# Patient Record
Sex: Male | Born: 1935 | Race: White | Hispanic: No | Marital: Married | Smoking: Never smoker
Health system: Southern US, Community
[De-identification: ages and names within clinical notes are randomized; demographics above are authoritative.]

## PROBLEM LIST (undated history)

## (undated) DIAGNOSIS — N189 Chronic kidney disease, unspecified: Secondary | ICD-10-CM

## (undated) DIAGNOSIS — K219 Gastro-esophageal reflux disease without esophagitis: Secondary | ICD-10-CM

## (undated) DIAGNOSIS — I442 Atrioventricular block, complete: Secondary | ICD-10-CM

## (undated) DIAGNOSIS — I428 Other cardiomyopathies: Secondary | ICD-10-CM

## (undated) DIAGNOSIS — I351 Nonrheumatic aortic (valve) insufficiency: Secondary | ICD-10-CM

## (undated) DIAGNOSIS — I10 Essential (primary) hypertension: Secondary | ICD-10-CM

## (undated) DIAGNOSIS — I4891 Unspecified atrial fibrillation: Secondary | ICD-10-CM

## (undated) HISTORY — PX: ABLATION: SHX5711

## (undated) HISTORY — PX: REPAIR OF ACUTE ASCENDING THORACIC AORTIC DISSECTION: SHX6323

## (undated) HISTORY — PX: PACEMAKER PLACEMENT: SHX43

---

## 2004-04-25 ENCOUNTER — Other Ambulatory Visit: Payer: Self-pay

## 2004-10-05 ENCOUNTER — Ambulatory Visit: Payer: Self-pay | Admitting: Urology

## 2006-01-11 ENCOUNTER — Ambulatory Visit: Payer: Self-pay | Admitting: Urology

## 2009-06-27 ENCOUNTER — Ambulatory Visit: Payer: Self-pay | Admitting: Internal Medicine

## 2009-07-02 ENCOUNTER — Ambulatory Visit: Payer: Self-pay | Admitting: Unknown Physician Specialty

## 2009-07-04 ENCOUNTER — Ambulatory Visit: Payer: Self-pay | Admitting: Unknown Physician Specialty

## 2011-04-28 ENCOUNTER — Ambulatory Visit: Payer: Self-pay | Admitting: Internal Medicine

## 2011-11-03 ENCOUNTER — Ambulatory Visit: Payer: Self-pay | Admitting: Vascular Surgery

## 2013-01-10 ENCOUNTER — Ambulatory Visit: Payer: Self-pay | Admitting: Internal Medicine

## 2013-03-19 ENCOUNTER — Ambulatory Visit: Payer: Self-pay | Admitting: Internal Medicine

## 2013-10-03 ENCOUNTER — Ambulatory Visit: Payer: Self-pay | Admitting: Internal Medicine

## 2014-04-26 ENCOUNTER — Inpatient Hospital Stay: Payer: Self-pay | Admitting: Internal Medicine

## 2014-04-26 LAB — CBC
HCT: 29.9 % — ABNORMAL LOW (ref 40.0–52.0)
HGB: 9.3 g/dL — ABNORMAL LOW (ref 13.0–18.0)
MCH: 30.8 pg (ref 26.0–34.0)
MCHC: 31 g/dL — ABNORMAL LOW (ref 32.0–36.0)
MCV: 99 fL (ref 80–100)
Platelet: 208 10*3/uL (ref 150–440)
RBC: 3.01 10*6/uL — ABNORMAL LOW (ref 4.40–5.90)
RDW: 17.3 % — AB (ref 11.5–14.5)
WBC: 7.8 10*3/uL (ref 3.8–10.6)

## 2014-04-26 LAB — COMPREHENSIVE METABOLIC PANEL
ALBUMIN: 3 g/dL — AB (ref 3.4–5.0)
ALT: 25 U/L (ref 12–78)
Alkaline Phosphatase: 145 U/L — ABNORMAL HIGH
Anion Gap: 5 — ABNORMAL LOW (ref 7–16)
BUN: 23 mg/dL — ABNORMAL HIGH (ref 7–18)
Bilirubin,Total: 0.9 mg/dL (ref 0.2–1.0)
CREATININE: 1.25 mg/dL (ref 0.60–1.30)
Calcium, Total: 8.1 mg/dL — ABNORMAL LOW (ref 8.5–10.1)
Chloride: 107 mmol/L (ref 98–107)
Co2: 28 mmol/L (ref 21–32)
EGFR (African American): >60
EGFR (Non-African Amer.): 53 — ABNORMAL LOW
Glucose: 91 mg/dL (ref 65–99)
OSMOLALITY: 283 (ref 275–301)
Potassium: 4.1 mmol/L (ref 3.5–5.1)
SGOT(AST): 29 U/L (ref 15–37)
Sodium: 140 mmol/L (ref 136–145)
Total Protein: 6.8 g/dL (ref 6.4–8.2)

## 2014-04-26 LAB — PROTIME-INR
INR: 1.9
Prothrombin Time: 21.2 secs — ABNORMAL HIGH (ref 11.5–14.7)

## 2014-04-27 LAB — CBC WITH DIFFERENTIAL/PLATELET
BASOS ABS: 0.1 10*3/uL (ref 0.0–0.1)
Basophil %: 1.2 %
EOS PCT: 8.3 %
Eosinophil #: 0.6 10*3/uL (ref 0.0–0.7)
HCT: 28.6 % — ABNORMAL LOW (ref 40.0–52.0)
HGB: 8.9 g/dL — AB (ref 13.0–18.0)
LYMPHS ABS: 1.1 10*3/uL (ref 1.0–3.6)
Lymphocyte %: 15.5 %
MCH: 30.8 pg (ref 26.0–34.0)
MCHC: 31.2 g/dL — ABNORMAL LOW (ref 32.0–36.0)
MCV: 99 fL (ref 80–100)
MONO ABS: 1 x10 3/mm (ref 0.2–1.0)
MONOS PCT: 13.8 %
NEUTROS PCT: 61.2 %
Neutrophil #: 4.4 10*3/uL (ref 1.4–6.5)
PLATELETS: 200 10*3/uL (ref 150–440)
RBC: 2.9 10*6/uL — ABNORMAL LOW (ref 4.40–5.90)
RDW: 17.4 % — AB (ref 11.5–14.5)
WBC: 7.1 10*3/uL (ref 3.8–10.6)

## 2014-04-27 LAB — PROTIME-INR
INR: 2.1
PROTHROMBIN TIME: 23.3 s — AB (ref 11.5–14.7)

## 2014-04-28 ENCOUNTER — Ambulatory Visit: Payer: Self-pay | Admitting: Orthopedic Surgery

## 2014-04-28 LAB — CBC WITH DIFFERENTIAL/PLATELET
BASOS ABS: 0.1 10*3/uL (ref 0.0–0.1)
BASOS PCT: 0.9 %
EOS ABS: 0.7 10*3/uL (ref 0.0–0.7)
Eosinophil %: 8.6 %
HCT: 28.5 % — AB (ref 40.0–52.0)
HGB: 9 g/dL — ABNORMAL LOW (ref 13.0–18.0)
Lymphocyte #: 1.3 10*3/uL (ref 1.0–3.6)
Lymphocyte %: 17 %
MCH: 31.4 pg (ref 26.0–34.0)
MCHC: 31.6 g/dL — AB (ref 32.0–36.0)
MCV: 99 fL (ref 80–100)
Monocyte #: 0.9 x10 3/mm (ref 0.2–1.0)
Monocyte %: 12.1 %
Neutrophil #: 4.7 10*3/uL (ref 1.4–6.5)
Neutrophil %: 61.4 %
PLATELETS: 188 10*3/uL (ref 150–440)
RBC: 2.87 10*6/uL — ABNORMAL LOW (ref 4.40–5.90)
RDW: 17.6 % — ABNORMAL HIGH (ref 11.5–14.5)
WBC: 7.6 10*3/uL (ref 3.8–10.6)

## 2014-04-28 LAB — BASIC METABOLIC PANEL
Anion Gap: 7 (ref 7–16)
BUN: 23 mg/dL — ABNORMAL HIGH (ref 7–18)
CALCIUM: 8.6 mg/dL (ref 8.5–10.1)
CREATININE: 1.35 mg/dL — AB (ref 0.60–1.30)
Chloride: 104 mmol/L (ref 98–107)
Co2: 27 mmol/L (ref 21–32)
GFR CALC AF AMER: 56 — AB
GFR CALC NON AF AMER: 48 — AB
GLUCOSE: 97 mg/dL (ref 65–99)
Osmolality: 279 (ref 275–301)
Potassium: 3.9 mmol/L (ref 3.5–5.1)
SODIUM: 138 mmol/L (ref 136–145)

## 2014-04-28 LAB — PROTIME-INR
INR: 2.1
Prothrombin Time: 23.4 secs — ABNORMAL HIGH (ref 11.5–14.7)

## 2014-04-29 LAB — CBC WITH DIFFERENTIAL/PLATELET
BASOS ABS: 0.1 10*3/uL (ref 0.0–0.1)
Basophil %: 1.4 %
EOS ABS: 0.7 10*3/uL (ref 0.0–0.7)
EOS PCT: 8.6 %
HCT: 28 % — ABNORMAL LOW (ref 40.0–52.0)
HGB: 8.9 g/dL — AB (ref 13.0–18.0)
Lymphocyte #: 1.3 10*3/uL (ref 1.0–3.6)
Lymphocyte %: 16.6 %
MCH: 31.4 pg (ref 26.0–34.0)
MCHC: 31.9 g/dL — AB (ref 32.0–36.0)
MCV: 99 fL (ref 80–100)
MONO ABS: 1 x10 3/mm (ref 0.2–1.0)
Monocyte %: 12.6 %
NEUTROS ABS: 4.9 10*3/uL (ref 1.4–6.5)
NEUTROS PCT: 60.8 %
Platelet: 199 10*3/uL (ref 150–440)
RBC: 2.84 10*6/uL — ABNORMAL LOW (ref 4.40–5.90)
RDW: 17.4 % — ABNORMAL HIGH (ref 11.5–14.5)
WBC: 8.1 10*3/uL (ref 3.8–10.6)

## 2014-04-29 LAB — PROTIME-INR
INR: 2.2
PROTHROMBIN TIME: 24.2 s — AB (ref 11.5–14.7)

## 2014-04-30 LAB — CBC WITH DIFFERENTIAL/PLATELET
Basophil #: 0.1 10*3/uL (ref 0.0–0.1)
Basophil %: 1.4 %
EOS ABS: 0.6 10*3/uL (ref 0.0–0.7)
Eosinophil %: 9.1 %
HCT: 27.8 % — ABNORMAL LOW (ref 40.0–52.0)
HGB: 8.6 g/dL — AB (ref 13.0–18.0)
LYMPHS ABS: 1.2 10*3/uL (ref 1.0–3.6)
LYMPHS PCT: 17.7 %
MCH: 30.6 pg (ref 26.0–34.0)
MCHC: 31 g/dL — AB (ref 32.0–36.0)
MCV: 99 fL (ref 80–100)
MONO ABS: 0.8 x10 3/mm (ref 0.2–1.0)
Monocyte %: 11.8 %
NEUTROS PCT: 60 %
Neutrophil #: 4.1 10*3/uL (ref 1.4–6.5)
Platelet: 200 10*3/uL (ref 150–440)
RBC: 2.82 10*6/uL — AB (ref 4.40–5.90)
RDW: 17.5 % — ABNORMAL HIGH (ref 11.5–14.5)
WBC: 6.8 10*3/uL (ref 3.8–10.6)

## 2014-04-30 LAB — BASIC METABOLIC PANEL
ANION GAP: 7 (ref 7–16)
BUN: 29 mg/dL — AB (ref 7–18)
CALCIUM: 8.1 mg/dL — AB (ref 8.5–10.1)
Chloride: 105 mmol/L (ref 98–107)
Co2: 27 mmol/L (ref 21–32)
Creatinine: 1.54 mg/dL — ABNORMAL HIGH (ref 0.60–1.30)
EGFR (Non-African Amer.): 41 — ABNORMAL LOW
GFR CALC AF AMER: 48 — AB
Glucose: 124 mg/dL — ABNORMAL HIGH (ref 65–99)
OSMOLALITY: 285 (ref 275–301)
POTASSIUM: 3.7 mmol/L (ref 3.5–5.1)
Sodium: 139 mmol/L (ref 136–145)

## 2014-04-30 LAB — PROTIME-INR
INR: 2.5
Prothrombin Time: 26.7 secs — ABNORMAL HIGH (ref 11.5–14.7)

## 2014-04-30 LAB — VANCOMYCIN, TROUGH: Vancomycin, Trough: 15 ug/mL (ref 10–20)

## 2014-05-01 LAB — CBC WITH DIFFERENTIAL/PLATELET
BASOS PCT: 0.8 %
Basophil #: 0.1 10*3/uL (ref 0.0–0.1)
EOS ABS: 0.8 10*3/uL — AB (ref 0.0–0.7)
Eosinophil %: 10.3 %
HCT: 28 % — ABNORMAL LOW (ref 40.0–52.0)
HGB: 8.8 g/dL — ABNORMAL LOW (ref 13.0–18.0)
LYMPHS ABS: 1.1 10*3/uL (ref 1.0–3.6)
Lymphocyte %: 14.4 %
MCH: 30.9 pg (ref 26.0–34.0)
MCHC: 31.5 g/dL — ABNORMAL LOW (ref 32.0–36.0)
MCV: 98 fL (ref 80–100)
MONOS PCT: 13.1 %
Monocyte #: 1 x10 3/mm (ref 0.2–1.0)
NEUTROS PCT: 61.4 %
Neutrophil #: 4.7 10*3/uL (ref 1.4–6.5)
Platelet: 202 10*3/uL (ref 150–440)
RBC: 2.85 10*6/uL — AB (ref 4.40–5.90)
RDW: 17.4 % — AB (ref 11.5–14.5)
WBC: 7.6 10*3/uL (ref 3.8–10.6)

## 2014-05-01 LAB — BASIC METABOLIC PANEL
Anion Gap: 9 (ref 7–16)
BUN: 24 mg/dL — ABNORMAL HIGH (ref 7–18)
CREATININE: 1.43 mg/dL — AB (ref 0.60–1.30)
Calcium, Total: 8.3 mg/dL — ABNORMAL LOW (ref 8.5–10.1)
Chloride: 103 mmol/L (ref 98–107)
Co2: 26 mmol/L (ref 21–32)
EGFR (African American): 52 — ABNORMAL LOW
EGFR (Non-African Amer.): 45 — ABNORMAL LOW
Glucose: 126 mg/dL — ABNORMAL HIGH (ref 65–99)
OSMOLALITY: 281 (ref 275–301)
Potassium: 3.6 mmol/L (ref 3.5–5.1)
SODIUM: 138 mmol/L (ref 136–145)

## 2014-05-01 LAB — PROTIME-INR
INR: 2.6
Prothrombin Time: 27 secs — ABNORMAL HIGH (ref 11.5–14.7)

## 2014-05-01 LAB — CULTURE, BLOOD (SINGLE)

## 2014-05-02 LAB — CBC WITH DIFFERENTIAL/PLATELET
Basophil #: 0.1 10*3/uL (ref 0.0–0.1)
Basophil %: 0.8 %
Eosinophil #: 0.8 10*3/uL — ABNORMAL HIGH (ref 0.0–0.7)
Eosinophil %: 9.7 %
HCT: 29.1 % — ABNORMAL LOW (ref 40.0–52.0)
HGB: 9.1 g/dL — ABNORMAL LOW (ref 13.0–18.0)
Lymphocyte #: 1.2 10*3/uL (ref 1.0–3.6)
Lymphocyte %: 14.4 %
MCH: 30.8 pg (ref 26.0–34.0)
MCHC: 31.2 g/dL — ABNORMAL LOW (ref 32.0–36.0)
MCV: 99 fL (ref 80–100)
MONOS PCT: 13.9 %
Monocyte #: 1.1 x10 3/mm — ABNORMAL HIGH (ref 0.2–1.0)
NEUTROS PCT: 61.2 %
Neutrophil #: 4.9 10*3/uL (ref 1.4–6.5)
PLATELETS: 199 10*3/uL (ref 150–440)
RBC: 2.95 10*6/uL — AB (ref 4.40–5.90)
RDW: 17.8 % — ABNORMAL HIGH (ref 11.5–14.5)
WBC: 8 10*3/uL (ref 3.8–10.6)

## 2014-05-02 LAB — COMPREHENSIVE METABOLIC PANEL
ALK PHOS: 130 U/L — AB
ALT: 17 U/L (ref 12–78)
AST: 23 U/L (ref 15–37)
Albumin: 2.8 g/dL — ABNORMAL LOW (ref 3.4–5.0)
Anion Gap: 6 — ABNORMAL LOW (ref 7–16)
BILIRUBIN TOTAL: 0.8 mg/dL (ref 0.2–1.0)
BUN: 24 mg/dL — AB (ref 7–18)
CO2: 28 mmol/L (ref 21–32)
CREATININE: 1.43 mg/dL — AB (ref 0.60–1.30)
Calcium, Total: 8.9 mg/dL (ref 8.5–10.1)
Chloride: 105 mmol/L (ref 98–107)
EGFR (African American): 52 — ABNORMAL LOW
EGFR (Non-African Amer.): 45 — ABNORMAL LOW
Glucose: 96 mg/dL (ref 65–99)
Osmolality: 281 (ref 275–301)
POTASSIUM: 3.4 mmol/L — AB (ref 3.5–5.1)
Sodium: 139 mmol/L (ref 136–145)
Total Protein: 6.4 g/dL (ref 6.4–8.2)

## 2014-05-02 LAB — IRON AND TIBC
IRON BIND. CAP.(TOTAL): 399 ug/dL (ref 250–450)
IRON: 33 ug/dL — AB (ref 65–175)
Iron Saturation: 8 %
Unbound Iron-Bind.Cap.: 366 ug/dL

## 2014-05-02 LAB — PROTIME-INR
INR: 2.6
PROTHROMBIN TIME: 27.4 s — AB (ref 11.5–14.7)

## 2014-05-02 LAB — TSH: Thyroid Stimulating Horm: 19.2 u[IU]/mL — ABNORMAL HIGH

## 2014-05-02 LAB — SEDIMENTATION RATE: ERYTHROCYTE SED RATE: 36 mm/h — AB (ref 0–20)

## 2014-05-02 LAB — RETICULOCYTES
ABSOLUTE RETIC COUNT: 0.073 10*6/uL (ref 0.019–0.186)
Reticulocyte: 2.5 % (ref 0.4–3.1)

## 2014-05-03 LAB — BASIC METABOLIC PANEL
ANION GAP: 7 (ref 7–16)
BUN: 29 mg/dL — AB (ref 7–18)
CALCIUM: 8.5 mg/dL (ref 8.5–10.1)
CO2: 27 mmol/L (ref 21–32)
Chloride: 104 mmol/L (ref 98–107)
Creatinine: 1.53 mg/dL — ABNORMAL HIGH (ref 0.60–1.30)
EGFR (African American): 48 — ABNORMAL LOW
GFR CALC NON AF AMER: 41 — AB
Glucose: 107 mg/dL — ABNORMAL HIGH (ref 65–99)
Osmolality: 282 (ref 275–301)
Potassium: 3.5 mmol/L (ref 3.5–5.1)
Sodium: 138 mmol/L (ref 136–145)

## 2014-05-03 LAB — PROTEIN ELECTROPHORESIS(ARMC)

## 2014-05-03 LAB — PROTIME-INR
INR: 2.7
PROTHROMBIN TIME: 28.1 s — AB (ref 11.5–14.7)

## 2014-05-03 LAB — HEMATOCRIT: HCT: 29.6 % — ABNORMAL LOW (ref 40.0–52.0)

## 2014-05-03 LAB — VANCOMYCIN, TROUGH: Vancomycin, Trough: 17 ug/mL (ref 10–20)

## 2014-05-07 LAB — UR PROT ELECTROPHORESIS, URINE RANDOM

## 2014-10-30 ENCOUNTER — Ambulatory Visit: Payer: Self-pay | Admitting: Internal Medicine

## 2014-12-03 ENCOUNTER — Ambulatory Visit: Payer: Self-pay | Admitting: Gastroenterology

## 2014-12-03 LAB — HEMOGLOBIN: HGB: 9.7 g/dL — AB (ref 13.0–18.0)

## 2015-03-01 NOTE — Consult Note (Signed)
CHIEF COMPLAINT and HISTORY:  Subjective/Chief Complaint Left leg cellulitis, failed outpatient tx   History of Present Illness 81 year old white male who fell few weeks ago and injured his left great toe. He then had worsening erythema and edema of the left leg which failed outpatient treatment with keflex. He is now currently on vanc and zosyn. He reports having congestive heart failure and having good control of his lower extremity edema with compression stockings and diuretics. He reports no significant pain to the left leg. No fevers.   PAST MEDICAL/SURGICAL HISTORY:  Past Medical History:   Gout:    Hypertension:    CHF:    Atrial fibrillation:    torn aorta:    Aortic Repair:    cardiac ablation:    Pacemaker:   ALLERGIES:  Allergies:  Oxycontin: Anaphylaxis, GI Distress  HOME MEDICATIONS:  Home Medications: Medication Instructions Status  cefuroxime 500 mg oral tablet 1 tab(s) orally 2 times a day x 10 days. Active  warfarin 5 mg oral tablet 1 tab(s) orally once a day (in the evening) Active  Aspirin Enteric Coated 81 mg oral delayed release tablet 1 tab(s) orally once a day Active  allopurinol 100 mg oral tablet 1 tab(s) orally 2 times a day Active  amitriptyline 25 mg oral tablet 1 or 2 tab(s) orally once a day (at bedtime) as needed for insomnia. Active  torsemide 20 mg oral tablet 1 tab(s) orally once a day (in the morning) and 2 tabs (6m) orally once a day (at bedtime) Active  levothyroxine 100 mcg (0.1 mg) oral tablet 1 tab(s) orally once a day Active  folic acid 1 mg oral tablet 1 tab(s) orally once a day Active   Family and Social History:  Family History Stroke   Social History negative tobacco, negative ETOH, negative Illicit drugs   Review of Systems:  Fever/Chills No   Cough No   Sputum No   Abdominal Pain No   Diarrhea No   Constipation No   Nausea/Vomiting No   SOB/DOE Yes  with laying flat   Chest Pain No   Physical Exam:   GEN no acute distress   HEENT PERRL, hearing intact to voice   NECK supple  No masses   RESP normal resp effort   CARD irregular rate  LE edema present   VASCULAR ACCESS 2+left DP, difficult to palpate right DP   ABD denies tenderness  soft  normal BS   LYMPH negative neck   EXTR negative cyanosis/clubbing, positive edema   SKIN Bilateral LE edema, erythema minimally on the right LE and extensive on the left leg- mostly the lower leg and foot but light erythema extends to the mid thigh, left great toe ulcer dressed, nontender to palpation   NEURO cranial nerves intact   PSYCH alert, A+O to time, place, person   LABS:  Laboratory Results: LabObservation:    21-Jun-15 12:23, [place] Low Extrem Left (Leg)  OBSERVATION   Reason for Test Edema  Hepatic:    19-Jun-15 12:40, Comprehensive Metabolic Panel  Bilirubin, Total 0.9  Alkaline Phosphatase 145  45-117  NOTE: New Reference Range  09/28/13  SGPT (ALT) 25  SGOT (AST) 29  Total Protein, Serum 6.8  Albumin, Serum 3.0  Routine Micro:    19-Jun-15 15:01, Blood Culture  Micro Text Report   BLOOD CULTURE    COMMENT  NO GROWTH IN 48 HOURS     ANTIBIOTIC  Culture Comment   NO GROWTH IN 48 HOURS   Result(s) reported on 28 Apr 2014 at 03:00PM.  Micro Text Report   BLOOD CULTURE    COMMENT                   NO GROWTH IN 48 HOURS     ANTIBIOTIC  Culture Comment   NO GROWTH IN 32 HOURS   Result(s) reported on 28 Apr 2014 at 03:00PM.  Routine Chem:    19-Jun-15 12:40, Comprehensive Metabolic Panel  Glucose, Serum 91  BUN 23  Creatinine (comp) 1.25  Sodium, Serum 140  Potassium, Serum 4.1  Chloride, Serum 107  CO2, Serum 28  Calcium (Total), Serum 8.1  Osmolality (calc) 283  eGFR (African American) >60  eGFR (Non-African American) 53  eGFR values <19m/min/1.73 m2 may be an indication of chronic  kidney disease (CKD).  Calculated eGFR is useful in patients with stable renal  function.  The eGFR calculation will not be reliable in acutely ill patients  when serum creatinine is changing rapidly. It is not useful in   patients on dialysis. The eGFR calculation may not be applicable  to patients at the low and high extremes of body sizes, pregnant  women, and vegetarians.  Anion Gap 5    21-Jun-15 080:99 Basic Metabolic Panel (w/Total Calcium)  Glucose, Serum 97  BUN 23  Creatinine (comp) 1.35  Sodium, Serum 138  Potassium, Serum 3.9  Chloride, Serum 104  CO2, Serum 27  Calcium (Total), Serum 8.6  Anion Gap 7  Osmolality (calc) 279  eGFR (African American) 56  eGFR (Non-African American) 48  eGFR values <643mmin/1.73 m2 may be an indication of chronic  kidney disease (CKD).  Calculated eGFR is useful in patients with stable renal function.  The eGFR calculation will not be reliable in acutely ill patients  when serum creatinine is changing rapidly. It is not useful in   patients on dialysis. The eGFR calculation may not be applicable  to patients at the low and high extremes of body sizes, pregnant  women, and vegetarians.  Routine Coag:    19-Jun-15 12:40, Prothrombin Time  Prothrombin 21.2  INR 1.9  INR reference interval applies to patients on anticoagulant therapy.  A single INR therapeutic range for coumarins is not optimal for all  indications; however, the suggested range for most indications is  2.0 - 3.0.  Exceptions to the INR Reference Range may include: Prosthetic heart  valves, acute myocardial infarction, prevention of myocardial  infarction, and combinations of aspirin and anticoagulant. The need  for a higher or lower target INR must be assessed individually.  Reference: The Pharmacology and Management of the Vitamin K   antagonists: the seventh ACCP Conference on Antithrombotic and  Thrombolytic Therapy. [id number]:126 (3suppl): [id number] A HCT value >55% may artifactually increase the PT.  In one study,   the increase was  an average of 25%.  Reference:  "Effect on Routine and Special Coagulation Testing Values  of Citrate Anticoagulant Adjustment in Patients with High HCT Values."  American Journal of Clinical Pathology 2006;126:400-405.    20-Jun-15 04:19, Prothrombin Time  Prothrombin 23.3  INR 2.1  INR reference interval applies to patients on anticoagulant therapy.  A single INR therapeutic range for coumarins is not optimal for all  indications; however, the suggested range for most indications is  2.0 - 3.0.  Exceptions to the INR Reference  Range may include: Prosthetic heart  valves, acute myocardial infarction, prevention of myocardial  infarction, and combinations of aspirin and anticoagulant. The need  for a higher or lower target INR must be assessed individually.  Reference: The Pharmacology and Management of the Vitamin K   antagonists: the seventh ACCP Conference on Antithrombotic and  Thrombolytic Therapy. [id number] Sept:126 (3suppl): [id number].  A HCT value >55% may artifactually increase the PT.  In one study,   the increase was an average of 25%.  Reference:  "Effect on Routine and Special Coagulation Testing Values  of Citrate Anticoagulant Adjustment in Patients with High HCT Values."  American Journal of Clinical Pathology 2006;126:400-405.    21-Jun-15 04:04, Prothrombin Time  Prothrombin 23.4  INR 2.1  INR reference interval applies to patients on anticoagulant therapy.  A single INR therapeutic range for coumarins is not optimal for all  indications; however, the suggested range for most indications is  2.0 - 3.0.  Exceptions to the INR Reference Range may include: Prosthetic heart  valves, acute myocardial infarction, prevention of myocardial  infarction, and combinations of aspirin and anticoagulant. The need  for a higher or lower target INR must be assessed individually.  Reference: The Pharmacology and Management of the Vitamin K   antagonists: the seventh ACCP  Conference on Antithrombotic and  Thrombolytic Therapy. [id number] Sept:126 (3suppl): [id number].  A HCT value >55% may artifactually increase the PT.  In one study,   the increase was an average of 25%.  Reference:  "Effect on Routine and Special Coagulation Testing Values  of Citrate Anticoagulant Adjustment in Patients with High HCT Values."  American Journal of Clinical Pathology 2006;126:400-405.    22-Jun-15 05:44, Prothrombin Time  Prothrombin 24.2  INR 2.2  INR reference interval applies to patients on anticoagulant therapy.  A single INR therapeutic range for coumarins is not optimal for all  indications; however, the suggested range for most indications is  2.0 - 3.0.  Exceptions to the INR Reference Range may include: Prosthetic heart  valves, acute myocardial infarction, prevention of myocardial  infarction, and combinations of aspirin and anticoagulant. The need  for a higher or lower target INR must be assessed individually.  Reference: The Pharmacology and Management of the Vitamin K   antagonists: the seventh ACCP Conference on Antithrombotic and  Thrombolytic Therapy. [id number] Sept:126 (3suppl): [id number].  A HCT value >55% may artifactually increase the PT.  In one study,   the increase was an average of 25%.  Reference:  "Effect on Routine and Special Coagulation Testing Values  of Citrate Anticoagulant Adjustment in Patients with High HCT Values."  American Journal of Clinical Pathology 2006;126:400-405.  Routine Hem:    19-Jun-15 12:40, Hemogram, Platelet Count  WBC (CBC) 7.8  RBC (CBC) 3.01  Hemoglobin (CBC) 9.3  Hematocrit (CBC) 29.9  Platelet Count (CBC) 208  Result(s) reported on 26 Apr 2014 at 12:57PM.  MCV 99  MCH 30.8  MCHC 31.0  RDW 17.3    20-Jun-15 04:19, CBC Profile  WBC (CBC) 7.1  RBC (CBC) 2.90  Hemoglobin (CBC) 8.9  Hematocrit (CBC) 28.6  Platelet Count (CBC) 200  MCV 99  MCH 30.8  MCHC 31.2  RDW 17.4  Neutrophil % 61.2  Lymphocyte  % 15.5  Monocyte % 13.8  Eosinophil % 8.3  Basophil % 1.2  Neutrophil # 4.4  Lymphocyte # 1.1  Monocyte # 1.0  Eosinophil # 0.6  Basophil # 0.1  Result(s) reported on 27 Apr 2014 at 05:31AM.  21-Jun-15 04:04, CBC Profile  WBC (CBC) 7.6  RBC (CBC) 2.87  Hemoglobin (CBC) 9.0  Hematocrit (CBC) 28.5  Platelet Count (CBC) 188  MCV 99  MCH 31.4  MCHC 31.6  RDW 17.6  Neutrophil % 61.4  Lymphocyte % 17.0  Monocyte % 12.1  Eosinophil % 8.6  Basophil % 0.9  Neutrophil # 4.7  Lymphocyte # 1.3  Monocyte # 0.9  Eosinophil # 0.7  Basophil # 0.1  Result(s) reported on 28 Apr 2014 at 04:49AM.    22-Jun-15 05:44, CBC Profile  WBC (CBC) 8.1  RBC (CBC) 2.84  Hemoglobin (CBC) 8.9  Hematocrit (CBC) 28.0  Platelet Count (CBC) 199  MCV 99  MCH 31.4  MCHC 31.9  RDW 17.4  Neutrophil % 60.8  Lymphocyte % 16.6  Monocyte % 12.6  Eosinophil % 8.6  Basophil % 1.4  Neutrophil # 4.9  Lymphocyte # 1.3  Monocyte # 1.0  Eosinophil # 0.7  Basophil # 0.1  Result(s) reported on 29 Apr 2014 at 06:07AM.   RADIOLOGY:  Radiology Results: [place]:    21-Jun-15 12:23, [place] Color Flow Doppler Low Extrem Left (Leg)  [place] Color Flow Doppler Low Extrem Left (Leg)  REASON FOR EXAM:    Edema  COMMENTS:       PROCEDURE: [place]  - US DOPPLER LOW EXTR LEFT  - Apr 28 2014 12:23PM     CLINICAL DATA:  Edema    EXAM:  Left LOWER EXTREMITY VENOUS DOPPLER ULTRASOUND    TECHNIQUE:  Gray-scale sonography with graded compression, as well as color  Doppler and duplex ultrasound were performed to evaluate the lower  extremity deep venous systems from the level of the common femoral  vein and including the common femoral, femoral, profunda femoral,  popliteal and calf veins including the posterior tibial, peroneal  and gastrocnemius veins when visible. The superficial great  saphenous vein was also interrogated. Spectral Doppler was utilized  to evaluate flow at rest and with distal augmentation maneuvers in  the  common femoral, femoral and popliteal veins.    COMPARISON:  None.    FINDINGS:  Common Femoral Vein: No evidence of thrombus. Normal  compressibility, respiratory phasicity and response to augmentation.    Saphenofemoral Junction: No evidence of thrombus. Normal  compressibility and flow on color Doppler imaging.  Profunda Femoral Vein: No evidence of thrombus. Normal  compressibility and flow on color Doppler imaging.    Femoral Vein: No evidence of thrombus. Normal compressibility,  respiratory phasicity and response to augmentation.    Popliteal Vein: No evidence of thrombus. Normal compressibility,  respiratory phasicity and response to augmentation.    Calf Veins: No evidence of thrombus. Normal compressibility and flow  on color Doppler imaging.    Superficial Great Saphenous Vein: No evidence of thrombus. Normal  compressibility and flow on color Doppler imaging.  Venous Reflux:  None.    Other Findings:  None.     IMPRESSION:  No evidence of deep venous thrombosis.      Electronically Signed    By: Margaree Mackintosh M.D.    On: 04/28/2014 12:24         Verified By: Mikki Santee, M.D., MD  LabUnknown:    26-Nov-14 10:49, CT Chest With Contrast  PACS Image    21-Jun-15 12:23, [place] Color Flow Doppler Low Extrem Left (Leg)  PACS Image  CT:    26-Nov-14 10:49, CT Chest With Contrast  CT Chest With Contrast  REASON FOR EXAM:  hemoptysis  COMMENTS:       PROCEDURE: KCT - KCT CHEST WITH CONTRAST  - Oct 03 2013 10:49AM     CLINICAL DATA:  Hemoptysis for 1 month.    EXAM:  CT CHEST WITH CONTRAST    TECHNIQUE:  Multidetector CT imaging of the chest was performed during  intravenous contrast administration.    CONTRAST:  75 cc Isovue 370  COMPARISON:  Multiple priors, including 03/19/2013.    FINDINGS:  Lungs/Pleura:  Minimal motion degradation inferiorly.    Patchy bibasilar opacities which are greater on the right than left.  Favored to represent  atelectasis. Similar.    Small right-sided pleural effusion is not significantly changed. The  left-sided pleural effusion has resolved    Heart/Mediastinum: Pacer with leads at right ventricle. Similar  appearance of a focal outpouching of the transverse aorta. This  measures maximally 1.8 cm on image 19. No surrounding hemorrhage.  Cardiomegaly with coronary artery atherosclerosis. No pericardial  effusion. Pulmonary artery enlargement, with the outflow tract  measuring 3.9 cm. Upper normal 3.0 cm. No central pulmonary  embolism, on this non-dedicated study.    Extensive mediastinal adenopathy. Right paratracheal node measures  1.6 cm on image 23 and is unchanged. A subcarinal/azygos esophageal  recess node measures 2.2 cm on image 31 and is similar. No hilar  adenopathy.    Upper Abdomen: Old granulomatous disease in the liver. A tiny  lateral segment left liver lobe lesion which is likely a cyst.    Bones/Musculoskeletal:  Remote bilateral rib trauma.   IMPRESSION:  1. No acute process or explanation for hemoptysis. Similar bibasilar  opacities which are favored to represent atelectasis.  2. Similar focal outpouching in the transverse aorta. Given the rib  posttraumatic defects, suspicious for chronic pseudoaneurysm. No  acute complication identified.  3. Similar mediastinal adenopathy. Relative stability back to  11/03/2011 argues for a benign/reactive etiology. Although  nonspecific, this could be related to underlying congestive heart  failure.  4. Resolved small left pleural effusion which residual small right  pleural effusion.  5. Pulmonary artery enlargement suggests pulmonary arterial  hypertension.  Electronically Signed    By: Abigail Miyamoto M.D.    On: 10/03/2013 11:29         Verified By: Areta Haber, M.D.,   ASSESSMENT AND PLAN:  Plan Left lower extremity cellulitis and edema with great toe ulceration- currently on vancomycin and zosyn. Venous duplex  negative for DVT. He has strong left DP pulse. He is elevating his leg at about waist level. Would be benefitial to elevate at or above heart level as tolerated to help reduce the edema. Recommend compression when infection improves. Also would recommend outpatient venous workup. Discussed with Dr. Lucky Cowboy. CHF Afib on anticoagulation   Electronic Signatures: Su Grand (PA-C)  (Signed 22-Jun-15 17:47)  Authored: Chief Complaint and History, PAST MEDICAL/SURGICAL HISTORY, ALLERGIES, HOME MEDICATIONS, Family and Social History, Review of Systems, Physical Exam, LABS, RADIOLOGY, Assessment and Plan   Last Updated: 22-Jun-15 17:47 by Su Grand (PA-C)

## 2015-03-01 NOTE — H&P (Signed)
PATIENT NAME:  Andre [name], Andre [name] DATE OF BIRTH:  May 27, 1929  DATE OF ADMISSION:  04/26/2014  PRIMARY CARE PHYSICIAN: Dr. Aram BeechamJeffrey [name].   CHIEF COMPLAINT: Left lower extremity redness and swelling.   HISTORY OF PRESENT ILLNESS: This is an 81 year old male who presented to the hospital due to worsening left lower extremity redness and swelling. The patient apparently fell a couple of weeks back and had an open area to his great toe on his left leg. He then a week later noticed some redness to his foot. He went to see his primary care physician, and he was started on oral Keflex. The patient has been on oral Keflex for about a week, but the redness has started to streak upwards into his legs. He therefore went back to see his primary care physician, who then referred him to the ER for further evaluation and possible treatment with IV antibiotics for suspected cellulitis. The patient denies any documented fever. Does admit to some chills but no nausea, no vomiting, no abdominal pain, no chest pain, no shortness of breath, and no other associated symptoms presently.   REVIEW OF SYSTEMS:    CONSTITUTIONAL: No documented fever. No weight gain. No weight loss.  EYES: No blurred or double vision.  EARS, NOSE, THROAT: No tinnitus. No postnasal drip. No redness of the oropharynx.  RESPIRATORY: No cough, no wheeze, no hemoptysis, no dyspnea.  CARDIOVASCULAR: No chest pain. No orthopnea. No palpitations. No syncope.  GASTROINTESTINAL: No nausea, no vomiting. No diarrhea. No abdominal pain. No melena or hematochezia.  GENITOURINARY: No dysuria. No hematuria.  ENDOCRINE: No polyuria, nocturia, heat or cold intolerance.  HEMATOLOGIC: No anemia, no bruising, no bleeding.  INTEGUMENTARY: Positive cellulitic rash of the left lower extremity. No lesions.  MUSCULOSKELETAL: No arthritis. No swelling. Positive gout.  NEUROLOGIC: No numbness. No tingling. No ataxia. No seizure-type activity.   PSYCHIATRIC: No anxiety. No insomnia. No ADD.   PAST MEDICAL HISTORY: Consistent with history of chronic A. fib, status post pacemaker, hypothyroidism, gout, hypertension.   ALLERGIES: ALLERGIC TO OXYCONTIN.   SOCIAL HISTORY: No smoking. No alcohol abuse. No illicit drug abuse. Lives at home by himself.   FAMILY HISTORY: Mother and father are both deceased. Both died from complications of a stroke.   CURRENT MEDICATIONS: As follows: Allopurinol 100 mg b.i.d., amitriptyline 25 mg at bedtime as needed, cefuroxime 500 mg b.i.d. x 10 days, folic acid 1 mg 1 mg daily, Synthroid 100 mcg daily, torsemide 20 mg daily in the morning and 2 tabs at bedtime, warfarin 5 mg daily.   PHYSICAL EXAMINATION: Presently is as follows:  VITAL SIGNS: Temperature 97.8, pulse 71, respirations 18, blood pressure 141/80, sats 97% on room air.  GENERAL: He is a pleasant-appearing male in no apparent distress.  HEAD, EYES, EARS, NOSE, THROAT: Atraumatic, normocephalic. Extraocular muscles are intact. Pupils equal and reactive to light. Sclerae anicteric. No conjunctival injection. No oropharyngeal erythema.  NECK: Supple. There is no jugular venous distention. No bruits. No lymphadenopathy. No thyromegaly.  HEART: Regular rate and rhythm. He does have a II/VI systolic ejection murmur heard at the right sternal border. No rubs, no clicks.  LUNGS: Clear to auscultation bilaterally. No rales, no rhonchi, no wheezes.  ABDOMEN: Soft, flat, nontender, nondistended. Has good bowel sounds. No hepatosplenomegaly appreciated.  EXTREMITIES: No evidence of any cyanosis or clubbing, +2 pedal and radial pulses bilaterally. The patient does have left lower extremity swelling, which is about 1 or 2+ compared to the right.  SKIN: Moist, warm, with a cellulitic rash in the left lower extremity, which is warm to touch and slightly swollen.  NEUROLOGIC: He is alert, awake, and oriented x 3 with no focal motor or sensory deficits  appreciated bilaterally.  LYMPHATIC: There is no cervical or axillary lymphadenopathy.   LABORATORY DATA: Serum glucose of 91, BUN 23, creatinine 1.2, sodium 140, potassium 4.1, chloride 107, bicarbonate 28. LFTs are within normal limits. White cell count 7.8, hemoglobin 9.3, hematocrit 29.9, platelet count 208. INR is 1.9.   ASSESSMENT AND PLAN: This is an 81 year old male with a history of atrial fibrillation, status post pacemaker, hypothyroidism, gout, hypertension, presented to the hospital with left lower extremity swelling and redness progressively getting worse and having failed outpatient oral antibiotic therapy.  1.  Left lower extremity cellulitis: This is the likely clinical diagnosis given the patient's clinical symptoms. The patient has failed outpatient oral antibiotics, as he was started on Keflex about a week ago, so the patient is being admitted for IV antibiotics. I will start him on IV vancomycin, follow blood cultures and follow him clinically. The patient is currently afebrile with a normal white cell count.  2.  History of chronic atrial fibrillation, status post pacemaker: The patient is currently rate controlled. I will continue his Coumadin. INR is 1.9.  3.  Hypothyroidism: Continue Synthroid.  4.  History of gout: No acute attack. Continue allopurinol.  5.  Hypertension: Continue torsemide.   The patient is a full code.   He will be transferred to be transferred over to Dr. Judithann [name]' service.   TIME SPENT: 45 minutes.    ____________________________ Andre [name]. Andre Kaiser, MD vjs:jcm D: 04/26/2014 15:18:14 ET T: 04/26/2014 16:25:25 ET JOB#: [id number]  cc: Andre [name]. Andre Kaiser, MD, <Dictator> Andre Siren MD ELECTRONICALLY SIGNED 05/07/2014 20:43

## 2015-03-01 NOTE — Consult Note (Signed)
PATIENT NAME:  Andre [name], Andre [name] MR#:  [id number] DATE OF BIRTH:  01-Dec-1928  INFECTIOUS DISEASE CONSULTATION  DATE OF CONSULTATION:  05/01/2014  REQUESTING PHYSICIAN:  Osborne Oman, M.D.   CONSULTING PHYSICIAN:  Stann Mainland. Sampson Goon, MD  REASON FOR CONSULTATION:  Bilateral lower extremity cellulitis.   HISTORY OF PRESENT ILLNESS: This is a pleasant 81 year old gentleman who is relatively healthy and still works as a Technical sales engineer, who was admitted June 19, with increasing left lower extremity redness and swelling. He apparently had fallen and twisted his left ankle and an opening of his left great toe. He seemed to think this was healing up, but then that week later the foot became quite red. He was seen by Dr. Judithann Sheen and placed on Keflex; however, he developed worsening redness and streaking. He was sent to the Emergency Room where he was admitted for IV antibiotics.   Since that admission, the patient has been on broad-spectrum antibiotics, but the redness and swelling had increased. He is not elevating his feet as he has been instructed. When I see him today, he is sitting upright in a chair with his feet completely on the ground. He has not had any fevers or shaking chills. He reports not having much in the way of these symptoms in the past. He does take Lasix as an outpatient and says that has been helping to control some of his edema.   PAST MEDICAL HISTORY: 1.  History of A. fib.   2.  Hypothyroidism.  3.  Gout.  4.  Hypertension.   PAST SURGICAL HISTORY: Status post pacemaker placement.   SOCIAL HISTORY: No tobacco or alcohol use. He lives at home. 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   FAMILY HISTORY: Noncontributory.   ALLERGIES: OXYCONTIN.   REVIEW OF SYSTEMS: 11-systems reviewed and negative, except as per HPI.    ANTIBIOTICS SINCE ADMISSION: Include;  1.  Zosyn, begun June 18. 2.  Vancomycin, begun June 18.    PHYSICAL EXAMINATION: VITAL SIGNS:  Temperature 97.9, pulse 76, blood pressure 120/55, respirations 18, sat 96% on room air. He has been afebrile since admission.  GENERAL: He is pleasant. He is overweight, sitting in chair eating.  HEENT: Pupils equal, round and reactive to light and accommodation. Extraocular movements are intact. Sclerae anicteric.  OROPHARYNX: Clear.  NECK: Supple.  HEART: Distant heart sounds.  LUNGS: Clear to auscultation bilaterally.  ABDOMEN: Soft, obese.  EXTREMITIES:  He has 3+ edema bilateral to his upper shin. He has erythema and redness on the left extending upwards from the dorsum of the foot. On the right, he also has erythema midway up his calf. These are chronic changes appearing. He has a small ulceration on his left great toe medially. There is minimal purulence from this.   DATABASE: Doppler of lower extremity was negative for DVT. White blood count on admission was 7.8, hemoglobin 9.3, platelets 208. He has not had an elevated white count since admission. His INR is currently therapeutic, 2.6. Blood cultures x 2 are negative. Renal function shows a BUN of 23 and creatinine 1.25 on admission; currently, it is 24/1.53. Albumin is low at 3.0, vancomycin trough of 15.   IMPRESSION: An 81 year old gentleman admitted with increasing lower extremity edema and redness following a scrape of his left toe. He now has bilateral redness, as well as swelling. I suspect the main issue is more of the edema as opposed to the cellulitis. This is not a failure of antibiotic,  because he is already on vancomycin and Zosyn, but more of a failure of elevation of the leg and control of the edema.   RECOMMENDATIONS: 1.  Consider echocardiogram, if it has not been done recently.  2.  I would recommend more aggressive intravenous diuresis.  3.  I have asked for Unna boot placement on both legs. This will help with decompressing the edema.  4.   I have advised the patient when he is in bed, he should elevate the leg on 6  pillows.   5.  I would continue current antibiotics at this time, but if he gets any improvement with the removal of fluid, he could be transitioned to an oral antibiotic and discharged home on this while we continue to mobilize fluid and I can see him in outpatient setting.   Thank you for the consultation. I will be glad to follow with you.    ____________________________ Stann Mainlandavid P. Sampson GoonFitzgerald, MD dpf:aj D: 05/01/2014 23:53:52 ET T: 05/02/2014 05:27:19 ET JOB#: [id number]  cc: Stann Mainlandavid P. Sampson GoonFitzgerald, MD, <Dictator> DAVID Sampson GoonFITZGERALD MD ELECTRONICALLY SIGNED 05/02/2014 13:32

## 2015-03-01 NOTE — Consult Note (Signed)
Admit Diagnosis:   CELLULITIS: Onset Date: 26-Apr-2014, Status: Active, Description: CELLULITIS      Admit Reason:   Cellulitis, leg (682.6): Status: Active, Coding System: ICD9, Coded Name: Cellulitis and abscess of leg, except foot   Oxycontin: Anaphylaxis, GI Distress   General Aspect LLE cellulitis   Present Illness 81yo M with 2 wk history of cellulitis.  He stumbled and incurred and great toes injury about two weeks ago that subsequently got infected 1 week later.  He was seen and given oral antibiotics that failed to control the infection, with it progressing to cellulitis.  He is now about 3 weeks out from the initial injury.  He states the swelling has be getting progressively worse over the past 2 weeks.  He denies fever, chills, and night sweats.  He does not report extreme pain.   Case History and Physical Exam:  Chief Complaint LLE Cellulitis   Past Medical Health Afib s/p Pacer   HEENT PERLA   Neck/Nodes Supple  No Adenopathy   Abdomen Benign   Musculoskeletal Other  Focused LLE: Erythema from proximal tibia to distal toes.  3+ Pretibia Pitting edema.  No pain with passive or active ankle plantar/dorsalflexion or toe flexion/ext.  PT palpable with excellent cap refill distally.  Non tender to palpation of anterior/lateral/posterior compartments.  sensation to light touch is intact, he does have mild dysasthesias   Neurological Grossly WNL   Skin Other  blanching erythema    Impression LLE Cellulitis, no signs of compartment syndrome.   Plan Recommend:  1. Continue broad spectrum IV Abx and transition to oral when starts to show improvement 2. LLE US for DVT despite therapeutic INR. 3. MRI to rule out abscess formation, if an abscess is localized, a surgical decompression/debridement would be recommended. 4. Elevate LLE 5. Clean dressing changes to great toe BID.  4x4 and small amount of abx ointment.  Thank you for the opportunity to participate in this  patients care.   Electronic Signatures: Elaina Hoopsandell, Timmothy R (MD)  (Signed 21-Jun-15 11:20)  Authored: Health Issues, Home Medications, Allergies, General Aspect/Present Illness, History and Physical Exam, Impression/Plan   Last Updated: 21-Jun-15 11:20 by Elaina Hoopsandell, Timmothy R (MD)

## 2015-03-01 NOTE — Discharge Summary (Signed)
PATIENT NAME:  Andre EnglishMCVEY, Calan R MR#:  [id number] DATE OF BIRTH:  1929/07/14  DATE OF ADMISSION:  04/26/2014. DATE OF DISCHARGE:  05/03/2014.  FINAL DIAGNOSES:  1.  Bilateral lower extremity cellulitis.  2.  Venous stasis, acute on chronic.  3.  Atrial fibrillation.  4.  Chronic kidney disease, stage III. 5.  Anemia with iron deficiency.  6.  Hypothyroidism.  7.  Gout.  8.  Hypertension.  9.  Status post pacemaker placement.   HISTORY AND PHYSICAL: Please see dictated admission history and physical.   SUMMARY OF HOSPITAL COURSE: The patient was admitted with erythema in the lower extremities, worse in the left leg than the right, which appeared to progress through outpatient antibiotics. He was placed on vancomycin, Zosyn. Erythema continued to be an issue, although he did not have significant fevers over the course of his hospitalization. Infectious disease saw the patient, and they thought that a significant portion of his redness was also due to venous stasis, and so [employer] were applied. He was changed over to oral antibiotics and monitored with no spike in fever, no elevation of white count or evidence of fevers. Infectious disease felt that he was stable for discharge to home and so he was discharged in stable condition with his physical activity to be up as tolerated, with plan to follow up with infectious disease in 1 week. He should follow a low sodium diet. Unna Boots were applied to the legs, and he should elevate his legs when able.   DISCHARGE MEDICATIONS:  1.  Folate 1 mg p.o. daily.  2.  Levothyroxine 0.1 mg p.o. daily.  3.  Amitriptyline 25 mg 1 to 2 tabs p.o. at bedtime as needed for insomnia.  4.  Allopurinol 100 mg p.o. b.i.d.  5.  Aspirin 81 mg p.o. daily.  6.  Coumadin 5 mg p.o. in the evening.  7.  Torsemide 20 mg 2 tablets p.o. b.i.d.  8.  Potassium 20 mEq p.o. daily.  9.  Augmentin 500 mg p.o. b.i.d. x 7 days.     ____________________________ Lynnea FerrierBert J. Brendaliz Kuk III,  MD bjk:lt D: 05/13/2014 07:55:05 ET T: 05/13/2014 09:20:19 ET JOB#: [id number]  cc: Lynnea FerrierBert J. Smitty Ackerley III, MD, <Dictator> Daniel NonesBERT Greene Diodato MD ELECTRONICALLY SIGNED 05/22/2014 7:51

## 2015-03-03 LAB — SURGICAL PATHOLOGY

## 2015-09-17 ENCOUNTER — Inpatient Hospital Stay
Admission: EM | Admit: 2015-09-17 | Discharge: 2015-10-09 | DRG: 291 | Disposition: E | Payer: Medicare Other | Attending: Internal Medicine | Admitting: Internal Medicine

## 2015-09-17 ENCOUNTER — Emergency Department: Payer: Medicare Other

## 2015-09-17 ENCOUNTER — Encounter: Payer: Self-pay | Admitting: Internal Medicine

## 2015-09-17 ENCOUNTER — Inpatient Hospital Stay
Admit: 2015-09-17 | Discharge: 2015-09-17 | Disposition: A | Discharge status: Home / Self Care | Payer: Medicare Other | Attending: Internal Medicine | Admitting: Internal Medicine

## 2015-09-17 DIAGNOSIS — J189 Pneumonia, unspecified organism: Secondary | ICD-10-CM | POA: Diagnosis present

## 2015-09-17 DIAGNOSIS — I472 Ventricular tachycardia: Secondary | ICD-10-CM | POA: Diagnosis not present

## 2015-09-17 DIAGNOSIS — I5023 Acute on chronic systolic (congestive) heart failure: Secondary | ICD-10-CM | POA: Diagnosis present

## 2015-09-17 DIAGNOSIS — K219 Gastro-esophageal reflux disease without esophagitis: Secondary | ICD-10-CM | POA: Diagnosis present

## 2015-09-17 DIAGNOSIS — R778 Other specified abnormalities of plasma proteins: Secondary | ICD-10-CM

## 2015-09-17 DIAGNOSIS — I959 Hypotension, unspecified: Secondary | ICD-10-CM | POA: Diagnosis present

## 2015-09-17 DIAGNOSIS — I130 Hypertensive heart and chronic kidney disease with heart failure and stage 1 through stage 4 chronic kidney disease, or unspecified chronic kidney disease: Secondary | ICD-10-CM | POA: Diagnosis present

## 2015-09-17 DIAGNOSIS — R0602 Shortness of breath: Secondary | ICD-10-CM

## 2015-09-17 DIAGNOSIS — Z823 Family history of stroke: Secondary | ICD-10-CM | POA: Diagnosis not present

## 2015-09-17 DIAGNOSIS — J9601 Acute respiratory failure with hypoxia: Secondary | ICD-10-CM | POA: Diagnosis present

## 2015-09-17 DIAGNOSIS — R7989 Other specified abnormal findings of blood chemistry: Secondary | ICD-10-CM

## 2015-09-17 DIAGNOSIS — N189 Chronic kidney disease, unspecified: Secondary | ICD-10-CM

## 2015-09-17 DIAGNOSIS — N400 Enlarged prostate without lower urinary tract symptoms: Secondary | ICD-10-CM | POA: Diagnosis present

## 2015-09-17 DIAGNOSIS — M109 Gout, unspecified: Secondary | ICD-10-CM | POA: Diagnosis present

## 2015-09-17 DIAGNOSIS — Z95810 Presence of automatic (implantable) cardiac defibrillator: Secondary | ICD-10-CM

## 2015-09-17 DIAGNOSIS — Z950 Presence of cardiac pacemaker: Secondary | ICD-10-CM | POA: Diagnosis not present

## 2015-09-17 DIAGNOSIS — N179 Acute kidney failure, unspecified: Secondary | ICD-10-CM | POA: Diagnosis present

## 2015-09-17 DIAGNOSIS — I482 Chronic atrial fibrillation: Secondary | ICD-10-CM | POA: Diagnosis present

## 2015-09-17 DIAGNOSIS — Z8249 Family history of ischemic heart disease and other diseases of the circulatory system: Secondary | ICD-10-CM | POA: Diagnosis not present

## 2015-09-17 DIAGNOSIS — I4901 Ventricular fibrillation: Secondary | ICD-10-CM | POA: Diagnosis present

## 2015-09-17 DIAGNOSIS — E785 Hyperlipidemia, unspecified: Secondary | ICD-10-CM | POA: Diagnosis present

## 2015-09-17 DIAGNOSIS — I429 Cardiomyopathy, unspecified: Secondary | ICD-10-CM | POA: Diagnosis present

## 2015-09-17 DIAGNOSIS — I351 Nonrheumatic aortic (valve) insufficiency: Secondary | ICD-10-CM | POA: Diagnosis present

## 2015-09-17 DIAGNOSIS — N183 Chronic kidney disease, stage 3 (moderate): Secondary | ICD-10-CM | POA: Diagnosis present

## 2015-09-17 DIAGNOSIS — E039 Hypothyroidism, unspecified: Secondary | ICD-10-CM | POA: Diagnosis present

## 2015-09-17 DIAGNOSIS — I469 Cardiac arrest, cause unspecified: Secondary | ICD-10-CM | POA: Diagnosis not present

## 2015-09-17 DIAGNOSIS — Z8673 Personal history of transient ischemic attack (TIA), and cerebral infarction without residual deficits: Secondary | ICD-10-CM | POA: Diagnosis not present

## 2015-09-17 DIAGNOSIS — I248 Other forms of acute ischemic heart disease: Secondary | ICD-10-CM | POA: Diagnosis present

## 2015-09-17 DIAGNOSIS — I509 Heart failure, unspecified: Secondary | ICD-10-CM

## 2015-09-17 DIAGNOSIS — J441 Chronic obstructive pulmonary disease with (acute) exacerbation: Secondary | ICD-10-CM | POA: Diagnosis present

## 2015-09-17 DIAGNOSIS — R739 Hyperglycemia, unspecified: Secondary | ICD-10-CM | POA: Diagnosis present

## 2015-09-17 HISTORY — DX: Unspecified atrial fibrillation: I48.91

## 2015-09-17 HISTORY — DX: Other cardiomyopathies: I42.8

## 2015-09-17 HISTORY — DX: Chronic kidney disease, unspecified: N18.9

## 2015-09-17 HISTORY — DX: Gastro-esophageal reflux disease without esophagitis: K21.9

## 2015-09-17 HISTORY — DX: Nonrheumatic aortic (valve) insufficiency: I35.1

## 2015-09-17 HISTORY — DX: Essential (primary) hypertension: I10

## 2015-09-17 HISTORY — DX: Atrioventricular block, complete: I44.2

## 2015-09-17 LAB — BRAIN NATRIURETIC PEPTIDE: B NATRIURETIC PEPTIDE 5: 387 pg/mL — AB (ref 0.0–100.0)

## 2015-09-17 LAB — CBC WITH DIFFERENTIAL/PLATELET
Basophils Absolute: 0.1 10*3/uL (ref 0–0.1)
Basophils Relative: 1 %
EOS ABS: 0.5 10*3/uL (ref 0–0.7)
Eosinophils Relative: 4 %
HCT: 48.5 % (ref 40.0–52.0)
HEMOGLOBIN: 15.7 g/dL (ref 13.0–18.0)
LYMPHS ABS: 4.3 10*3/uL — AB (ref 1.0–3.6)
LYMPHS PCT: 34 %
MCH: 35.1 pg — AB (ref 26.0–34.0)
MCHC: 32.3 g/dL (ref 32.0–36.0)
MCV: 108.5 fL — AB (ref 80.0–100.0)
MONOS PCT: 7 %
Monocytes Absolute: 0.9 10*3/uL (ref 0.2–1.0)
Neutro Abs: 6.7 10*3/uL — ABNORMAL HIGH (ref 1.4–6.5)
Neutrophils Relative %: 54 %
Platelets: 199 10*3/uL (ref 150–440)
RBC: 4.47 MIL/uL (ref 4.40–5.90)
RDW: 15 % — ABNORMAL HIGH (ref 11.5–14.5)
WBC: 12.5 10*3/uL — AB (ref 3.8–10.6)

## 2015-09-17 LAB — BLOOD GAS, ARTERIAL
ALLENS TEST (PASS/FAIL): POSITIVE — AB
Acid-base deficit: 3.9 mmol/L — ABNORMAL HIGH (ref 0.0–2.0)
Bicarbonate: 22.2 mEq/L (ref 21.0–28.0)
FIO2: 0.44
O2 SAT: 93.4 %
PATIENT TEMPERATURE: 37
pCO2 arterial: 43 mmHg (ref 32.0–48.0)
pH, Arterial: 7.32 — ABNORMAL LOW (ref 7.350–7.450)
pO2, Arterial: 74 mmHg — ABNORMAL LOW (ref 83.0–108.0)

## 2015-09-17 LAB — LACTIC ACID, PLASMA: LACTIC ACID, VENOUS: 4.2 mmol/L — AB (ref 0.5–2.0)

## 2015-09-17 LAB — TROPONIN I
TROPONIN I: 0.08 ng/mL — AB (ref <0.031)
Troponin I: 0.11 ng/mL — ABNORMAL HIGH (ref <0.031)
Troponin I: 0.12 ng/mL — ABNORMAL HIGH (ref <0.031)

## 2015-09-17 LAB — COMPREHENSIVE METABOLIC PANEL
ALK PHOS: 132 U/L — AB (ref 38–126)
ALT: 19 U/L (ref 17–63)
AST: 30 U/L (ref 15–41)
Albumin: 4.2 g/dL (ref 3.5–5.0)
Anion gap: 11 (ref 5–15)
BUN: 34 mg/dL — ABNORMAL HIGH (ref 6–20)
CO2: 24 mmol/L (ref 22–32)
CREATININE: 1.63 mg/dL — AB (ref 0.61–1.24)
Calcium: 9.3 mg/dL (ref 8.9–10.3)
Chloride: 105 mmol/L (ref 101–111)
GFR calc non Af Amer: 37 mL/min — ABNORMAL LOW (ref >60)
GFR, EST AFRICAN AMERICAN: 43 mL/min — AB (ref >60)
Glucose, Bld: 334 mg/dL — ABNORMAL HIGH (ref 65–99)
Potassium: 4.9 mmol/L (ref 3.5–5.1)
Sodium: 140 mmol/L (ref 135–145)
Total Bilirubin: 1.3 mg/dL — ABNORMAL HIGH (ref 0.3–1.2)
Total Protein: 8 g/dL (ref 6.5–8.1)

## 2015-09-17 LAB — GLUCOSE, CAPILLARY
Glucose-Capillary: 191 mg/dL — ABNORMAL HIGH (ref 65–99)
Glucose-Capillary: 214 mg/dL — ABNORMAL HIGH (ref 65–99)

## 2015-09-17 LAB — HEMOGLOBIN A1C: Hgb A1c MFr Bld: 5.6 % (ref 4.0–6.0)

## 2015-09-17 MED ORDER — TURMERIC 500 MG PO CAPS: 1.0000 | ORAL_CAPSULE | Freq: Every day | ORAL | Status: DC

## 2015-09-17 MED ORDER — FUROSEMIDE 10 MG/ML IJ SOLN
40.0000 mg | Freq: Once | INTRAMUSCULAR | Status: AC
  Administered 2015-09-17: 40 mg via INTRAVENOUS
  Filled 2015-09-17: qty 4

## 2015-09-17 MED ORDER — DOCUSATE SODIUM 100 MG PO CAPS
100.0000 mg | ORAL_CAPSULE | Freq: Two times a day (BID) | ORAL | Status: DC
  Administered 2015-09-17 – 2015-09-18 (×3): 100 mg via ORAL
  Filled 2015-09-17 (×3): qty 1

## 2015-09-17 MED ORDER — ACETAMINOPHEN 325 MG PO TABS: 650.0000 mg | ORAL_TABLET | Freq: Four times a day (QID) | ORAL | Status: DC | PRN

## 2015-09-17 MED ORDER — ATORVASTATIN CALCIUM 20 MG PO TABS
20.0000 mg | ORAL_TABLET | Freq: Every day | ORAL | Status: DC
  Administered 2015-09-17 – 2015-09-18 (×2): 20 mg via ORAL
  Filled 2015-09-17 (×2): qty 1

## 2015-09-17 MED ORDER — GARLIC 500 MG PO TABS: 1.0000 | ORAL_TABLET | Freq: Every day | ORAL | Status: DC

## 2015-09-17 MED ORDER — ALBUTEROL SULFATE (2.5 MG/3ML) 0.083% IN NEBU
INHALATION_SOLUTION | RESPIRATORY_TRACT | Status: AC
  Administered 2015-09-17: 15 mg
  Filled 2015-09-17: qty 15

## 2015-09-17 MED ORDER — FOLIC ACID 1 MG PO TABS
1.0000 mg | ORAL_TABLET | Freq: Every day | ORAL | Status: DC
  Administered 2015-09-17 – 2015-09-18 (×2): 1 mg via ORAL
  Filled 2015-09-17 (×2): qty 1

## 2015-09-17 MED ORDER — ASPIRIN 325 MG PO TABS
325.0000 mg | ORAL_TABLET | Freq: Once | ORAL | Status: AC
  Administered 2015-09-17: 325 mg via ORAL
  Filled 2015-09-17: qty 1

## 2015-09-17 MED ORDER — METOPROLOL SUCCINATE ER 100 MG PO TB24
100.0000 mg | ORAL_TABLET | Freq: Every day | ORAL | Status: DC
Start: 2015-09-18 — End: 2015-09-19
  Administered 2015-09-18: 100 mg via ORAL
  Filled 2015-09-17: qty 1

## 2015-09-17 MED ORDER — LEVOTHYROXINE SODIUM 100 MCG PO TABS
100.0000 ug | ORAL_TABLET | Freq: Every day | ORAL | Status: DC
  Administered 2015-09-18: 100 ug via ORAL
  Filled 2015-09-17: qty 1

## 2015-09-17 MED ORDER — POLYETHYLENE GLYCOL 3350 17 G PO PACK: 17.0000 g | PACK | Freq: Every day | ORAL | Status: DC | PRN

## 2015-09-17 MED ORDER — INSULIN ASPART 100 UNIT/ML ~~LOC~~ SOLN
0.0000 [IU] | Freq: Three times a day (TID) | SUBCUTANEOUS | Status: DC
  Administered 2015-09-18 (×2): 1 [IU] via SUBCUTANEOUS
  Filled 2015-09-17 (×2): qty 1
  Filled 2015-09-17: qty 2

## 2015-09-17 MED ORDER — APIXABAN 2.5 MG PO TABS
2.5000 mg | ORAL_TABLET | Freq: Two times a day (BID) | ORAL | Status: DC
  Administered 2015-09-17 – 2015-09-18 (×3): 2.5 mg via ORAL
  Filled 2015-09-17 (×3): qty 1

## 2015-09-17 MED ORDER — GLUCOSAMINE-CHONDROITIN 500-400 MG PO TABS: 1.0000 | ORAL_TABLET | Freq: Three times a day (TID) | ORAL | Status: DC

## 2015-09-17 MED ORDER — ONDANSETRON HCL 4 MG/2ML IJ SOLN: 4.0000 mg | Freq: Four times a day (QID) | INTRAMUSCULAR | Status: DC | PRN

## 2015-09-17 MED ORDER — ONDANSETRON HCL 4 MG PO TABS: 4.0000 mg | ORAL_TABLET | Freq: Four times a day (QID) | ORAL | Status: DC | PRN

## 2015-09-17 MED ORDER — DEXTROSE 5 % IV SOLN
1.0000 g | INTRAVENOUS | Status: DC
  Administered 2015-09-18: 1 g via INTRAVENOUS
  Filled 2015-09-17 (×2): qty 10

## 2015-09-17 MED ORDER — IPRATROPIUM BROMIDE 0.02 % IN SOLN
0.5000 mg | Freq: Once | RESPIRATORY_TRACT | Status: AC
  Administered 2015-09-17: 0.5 mg via RESPIRATORY_TRACT
  Filled 2015-09-17: qty 2.5

## 2015-09-17 MED ORDER — FUROSEMIDE 10 MG/ML IJ SOLN
40.0000 mg | Freq: Two times a day (BID) | INTRAMUSCULAR | Status: DC
  Administered 2015-09-17 – 2015-09-18 (×2): 40 mg via INTRAVENOUS
  Filled 2015-09-17 (×2): qty 4

## 2015-09-17 MED ORDER — TRAZODONE HCL 50 MG PO TABS
25.0000 mg | ORAL_TABLET | Freq: Every evening | ORAL | Status: DC | PRN
  Administered 2015-09-17: 25 mg via ORAL
  Filled 2015-09-17 (×2): qty 1

## 2015-09-17 MED ORDER — ALBUTEROL (5 MG/ML) CONTINUOUS INHALATION SOLN: 15.0000 mg/h | INHALATION_SOLUTION | Freq: Once | RESPIRATORY_TRACT | Status: AC

## 2015-09-17 MED ORDER — DEXTROSE 5 % IV SOLN
1.0000 g | Freq: Once | INTRAVENOUS | Status: AC
  Administered 2015-09-17: 1 g via INTRAVENOUS
  Filled 2015-09-17: qty 10

## 2015-09-17 MED ORDER — COLCHICINE 0.6 MG PO TABS
0.6000 mg | ORAL_TABLET | Freq: Every day | ORAL | Status: DC
  Administered 2015-09-17 – 2015-09-18 (×2): 0.6 mg via ORAL
  Filled 2015-09-17 (×2): qty 1

## 2015-09-17 MED ORDER — INSULIN ASPART 100 UNIT/ML ~~LOC~~ SOLN
0.0000 [IU] | Freq: Every day | SUBCUTANEOUS | Status: DC
  Administered 2015-09-17: 2 [IU] via SUBCUTANEOUS
  Filled 2015-09-17: qty 2

## 2015-09-17 MED ORDER — ACETAMINOPHEN 650 MG RE SUPP: 650.0000 mg | Freq: Four times a day (QID) | RECTAL | Status: DC | PRN

## 2015-09-17 MED ORDER — POTASSIUM CHLORIDE CRYS ER 20 MEQ PO TBCR
20.0000 meq | EXTENDED_RELEASE_TABLET | Freq: Every day | ORAL | Status: AC
  Administered 2015-09-17 – 2015-09-18 (×2): 20 meq via ORAL
  Filled 2015-09-17 (×2): qty 1

## 2015-09-17 MED ORDER — ALLOPURINOL 100 MG PO TABS
100.0000 mg | ORAL_TABLET | Freq: Two times a day (BID) | ORAL | Status: DC
  Administered 2015-09-17 – 2015-09-18 (×2): 100 mg via ORAL
  Filled 2015-09-17 (×2): qty 1

## 2015-09-17 MED ORDER — AZITHROMYCIN 250 MG PO TABS
500.0000 mg | ORAL_TABLET | Freq: Every day | ORAL | Status: DC
  Administered 2015-09-18: 500 mg via ORAL
  Filled 2015-09-17: qty 2

## 2015-09-17 MED ORDER — ASPIRIN EC 81 MG PO TBEC
81.0000 mg | DELAYED_RELEASE_TABLET | Freq: Every day | ORAL | Status: DC
  Administered 2015-09-18: 81 mg via ORAL
  Filled 2015-09-17: qty 1

## 2015-09-17 MED ORDER — PNEUMOCOCCAL VAC POLYVALENT 25 MCG/0.5ML IJ INJ
0.5000 mL | INJECTION | INTRAMUSCULAR | Status: AC
  Administered 2015-09-18: 0.5 mL via INTRAMUSCULAR
  Filled 2015-09-17: qty 0.5

## 2015-09-17 MED ORDER — METHYLPREDNISOLONE SODIUM SUCC 125 MG IJ SOLR
125.0000 mg | Freq: Once | INTRAMUSCULAR | Status: AC
  Administered 2015-09-17: 125 mg via INTRAVENOUS
  Filled 2015-09-17: qty 2

## 2015-09-17 MED ORDER — METOPROLOL SUCCINATE ER 100 MG PO TB24: 100.0000 mg | ORAL_TABLET | Freq: Every day | ORAL | Status: DC

## 2015-09-17 MED ORDER — SODIUM CHLORIDE 0.9 % IJ SOLN
3.0000 mL | Freq: Two times a day (BID) | INTRAMUSCULAR | Status: DC
  Administered 2015-09-17 – 2015-09-18 (×2): 3 mL via INTRAVENOUS

## 2015-09-17 MED ORDER — IPRATROPIUM-ALBUTEROL 0.5-2.5 (3) MG/3ML IN SOLN
3.0000 mL | RESPIRATORY_TRACT | Status: DC
  Administered 2015-09-17 – 2015-09-18 (×8): 3 mL via RESPIRATORY_TRACT
  Filled 2015-09-17 (×8): qty 3

## 2015-09-17 MED ORDER — FERROUS SULFATE 325 (65 FE) MG PO TABS
325.0000 mg | ORAL_TABLET | Freq: Every day | ORAL | Status: DC
  Administered 2015-09-17 – 2015-09-18 (×2): 325 mg via ORAL
  Filled 2015-09-17 (×2): qty 1

## 2015-09-17 MED ORDER — DEXTROSE 5 % IV SOLN
500.0000 mg | Freq: Once | INTRAVENOUS | Status: AC
  Administered 2015-09-17: 500 mg via INTRAVENOUS
  Filled 2015-09-17: qty 500

## 2015-09-17 NOTE — ED Provider Notes (Addendum)
CSN: [id number]     Arrival date & time 09/23/2015  6213 History   First MD Initiated Contact with Patient 09/18/2015 0930     Chief Complaint  Patient presents with  . Respiratory Distress     (Consider location/radiation/quality/duration/timing/severity/associated sxs/prior Treatment) The history is provided by the patient.  MATTHIAS BOGUS is a 81 y.o. male here with shortness of breath. Patient has a history of A. fib on eliquis, COPD, CHF here with shortness of breath. Every onset of shortness of breath this morning. Denies any cough or fever prior to this. Denies any chest pains. He was noted to be hypoxic about 82% as per EMS. She was placed on CPAP by EMS. No recent admissions  Level V caveat- condition of patient    No past medical history on file. No past surgical history on file. No family history on file. Social History  Substance Use Topics  . Smoking status: Not on file  . Smokeless tobacco: Not on file  . Alcohol Use: Not on file    Review of Systems  Respiratory: Positive for shortness of breath.   All other systems reviewed and are negative.     Allergies  Review of patient's allergies indicates not on file.  Home Medications   Prior to Admission medications   Medication Sig Start Date End Date Taking? Authorizing Provider  allopurinol (ZYLOPRIM) 100 MG tablet Take 100 mg by mouth 2 (two) times daily.   Yes Historical Provider, MD  apixaban (ELIQUIS) 2.5 MG TABS tablet Take 2.5 mg by mouth 2 (two) times daily.   Yes Historical Provider, MD  aspirin EC 81 MG tablet Take 81 mg by mouth daily.   Yes Historical Provider, MD  atorvastatin (LIPITOR) 20 MG tablet Take 20 mg by mouth daily.   Yes Historical Provider, MD  colchicine 0.6 MG tablet Take 0.6 mg by mouth daily.   Yes Historical Provider, MD  Ferrous Sulfate (IRON) 325 (65 FE) MG TABS Take 1 tablet by mouth daily.   Yes Historical Provider, MD  folic acid (FOLVITE) 1 MG tablet Take 1 mg by mouth daily.    Yes Historical Provider, MD  Garlic 500 MG TABS Take 1 tablet by mouth daily.   Yes Historical Provider, MD  glucosamine-chondroitin 500-400 MG tablet Take 1 tablet by mouth 3 (three) times daily.   Yes Historical Provider, MD  levothyroxine (SYNTHROID, LEVOTHROID) 100 MCG tablet Take 100 mcg by mouth daily before breakfast.   Yes Historical Provider, MD  metolazone (ZAROXOLYN) 2.5 MG tablet Take 2.5 mg by mouth every other day.   Yes Historical Provider, MD  metoprolol succinate (TOPROL-XL) 100 MG 24 hr tablet Take 100 mg by mouth daily. Take with or immediately following a meal.   Yes Historical Provider, MD  potassium chloride SA (K-DUR,KLOR-CON) 20 MEQ tablet Take 20 mEq by mouth daily.   Yes Historical Provider, MD  torsemide (DEMADEX) 20 MG tablet Take 20 mg by mouth daily.   Yes Historical Provider, MD  Turmeric 500 MG CAPS Take 1 capsule by mouth daily.   Yes Historical Provider, MD   BP 88/43 mmHg  Pulse 70  Resp 22  Ht  (1.778 m)  Wt 206 lb (93.441 kg)  BMI 29.56 kg/m2  SpO2 94% Physical Exam  Constitutional: He is oriented to person, place, and time. He appears well-developed.  Tachypneic, on bipap   HENT:  Head: Normocephalic.  Mouth/Throat: Oropharynx is clear and moist.  Eyes: Pupils are equal, round,  and reactive to light.  Neck: Normal range of motion.  Cardiovascular: Normal rate, regular rhythm and normal heart sounds.   Pulmonary/Chest:  Crackles bilateral bases, + mild wheezing throughout.   Abdominal: Soft. Bowel sounds are normal. He exhibits no distension. There is no tenderness. There is no rebound.  Musculoskeletal:  Trace edema bilaterally   Neurological: He is alert and oriented to person, place, and time.  Skin: Skin is warm and dry.  Psychiatric: He has a normal mood and affect. His behavior is normal. Judgment and thought content normal.  Nursing note and vitals reviewed.   ED Course  Procedures (including critical care time)   CRITICAL  CARE Performed by: Silverio Lay, DAVID   Total critical care time: 30 minutes  Critical care time was exclusive of separately billable procedures and treating other patients.  Critical care was necessary to treat or prevent imminent or life-threatening deterioration.  Critical care was time spent personally by me on the following activities: development of treatment plan with patient and/or surrogate as well as nursing, discussions with consultants, evaluation of patient's response to treatment, examination of patient, obtaining history from patient or surrogate, ordering and performing treatments and interventions, ordering and review of laboratory studies, ordering and review of radiographic studies, pulse oximetry and re-evaluation of patient's condition.   Angiocath insertion Performed by: Chaney Malling  Consent: Verbal consent obtained. Risks and benefits: risks, benefits and alternatives were discussed Time out: Immediately prior to procedure a "time out" was called to verify the correct patient, procedure, equipment, support staff and site/side marked as required.  Preparation: Patient was prepped and draped in the usual sterile fashion.  Vein Location: R brachial  Ultrasound Guided  Gauge: 20 long  Normal blood return and flush without difficulty Patient tolerance: Patient tolerated the procedure well with no immediate complications.     Labs Review Labs Reviewed  CBC WITH DIFFERENTIAL/PLATELET - Abnormal; Notable for the following:    WBC 12.5 (*)    MCV 108.5 (*)    MCH 35.1 (*)    RDW 15.0 (*)    Neutro Abs 6.7 (*)    Lymphs Abs 4.3 (*)    All other components within normal limits  COMPREHENSIVE METABOLIC PANEL - Abnormal; Notable for the following:    Glucose, Bld 334 (*)    BUN 34 (*)    Creatinine, Ser 1.63 (*)    Alkaline Phosphatase 132 (*)    Total Bilirubin 1.3 (*)    GFR calc non Af Amer 37 (*)    GFR calc Af Amer 43 (*)    All other components within normal  limits  BRAIN NATRIURETIC PEPTIDE - Abnormal; Notable for the following:    B Natriuretic Peptide 387.0 (*)    All other components within normal limits  TROPONIN I - Abnormal; Notable for the following:    Troponin I 0.08 (*)    All other components within normal limits  LACTIC ACID, PLASMA - Abnormal; Notable for the following:    Lactic Acid, Venous 4.2 (*)    All other components within normal limits  BLOOD GAS, ARTERIAL - Abnormal; Notable for the following:    pH, Arterial 7.32 (*)    pO2, Arterial 74 (*)    Acid-base deficit 3.9 (*)    Allens test (pass/fail) POSITIVE (*)    All other components within normal limits  CULTURE, BLOOD (ROUTINE X 2)  CULTURE, BLOOD (ROUTINE X 2)    Imaging Review Dg Chest [facility] 1 [address]  09/22/2015  CLINICAL DATA:  Shortness of breath EXAM: PORTABLE CHEST 1 VIEW COMPARISON:  10/03/2013 chest CT FINDINGS: Single lead left subclavian pacemaker is noted with lead tip overlying the right ventricle. Stable cardiomediastinal silhouette with mild cardiomegaly. No pneumothorax. Small right pleural effusion. There diffuse parahilar linear and fluffy opacities in both lungs. There are superimposed patchy bibasilar lung opacities. IMPRESSION: 1. Stable mild cardiomegaly. Diffuse parahilar linear and fluffy opacities in both lungs, favor mild to moderate pulmonary edema from congestive heart failure. 2. Superimposed patchy bibasilar lung opacities, nonspecific, likely atelectasis, cannot exclude aspiration or pneumonia. 3. Small right pleural effusion. Electronically Signed   By: Delbert PhenixJason A Poff M.D.   On: 03-21-2015 09:58   I have personally reviewed and evaluated these images and lab results as part of my medical decision-making.   EKG Interpretation None      ED ECG REPORT I, YAO, DAVID, the attending physician, personally viewed and interpreted this ECG.   Date: 03-21-2015  EKG Time: 9: 22 AM  Rate:77  Rhythm: paced  Axis: normal  Intervals:nonspecific  intraventricular conduction delay  ST&T Change: paced    MDM   Final diagnoses:  None    Annye EnglishGordon R Burson is a 81 y.o. male here with SOB. Consider COPD vs CHF vs pneumonia. Will get labs, CXR, BNP.  10: 52 AM CXR showed possible pneumonia vs CHF. Doing well on bipap. Given lasix 40 mg IV. Trop 0.08, likely from demand ischemia. Will get ABG and if doing well, can dc bipap.   12:14 PM  Off bipap now. ABG reassuring. Lactate 4.2, given abx. Trop 0.08 likely demand ischemia. Will admit for COPD/CHF, CAP, elevated troponin and lactate.    Richardean Canalavid H Yao, MD June 24, 2015 1215  Richardean Canalavid H Yao, MD June 24, 2015 1250

## 2015-09-17 NOTE — ED Notes (Signed)
Pt from home with difficulty breathing. Pt has hx of copd and chf. On Eliquis.
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
## 2015-09-17 NOTE — ED Notes (Signed)
Attempted to call report. Nurse unable at this time

## 2015-09-17 NOTE — ED Notes (Signed)
Called report to floor. RN, Bonna Gainsonnesha, states, "Let me talk to my charge nurse. I'll call you back".

## 2015-09-17 NOTE — Progress Notes (Signed)
Annye EnglishGordon R Feeser is a 81 y.o. male patient admitted from ED awake, alert - oriented  X 4 - no acute distress noted.  VSS - Blood pressure 94/49, pulse 72, temperature 97.8 F (36.6 C), temperature source Oral, resp. rate 22, height 5\' 10"  (1.778 m), weight 93.441 kg (206 lb), SpO2 95 %.    IV in place, occlusive dsg intact without redness.  Orientation to room, and floor completed with information packet given to patient/family.  Admission INP armband ID verified with patient/family, and in place.   SR up x 2, fall assessment complete, with patient and family able to verbalize understanding of risk associated with falls, and verbalized understanding to call nsg before up out of bed.  Call light within reach, patient able to voice, and demonstrate understanding.  Skin, clean-dry- intact without evidence of bruising, or skin tears.   No evidence of skin break down noted on exam. Skin assessed with Leah.      Will cont to eval and treat per MD orders.  Rudean Haskellonnisha R Ulyssa Walthour, RN 10/06/2015 6:15 PM

## 2015-09-17 NOTE — Progress Notes (Signed)
Notified Dr. Nemiah CommanderKalisetti about Troponin level of 0.11 and MD aware that patient refused insulin coverage for 1700.

## 2015-09-17 NOTE — Progress Notes (Signed)
PHARMACIST - PHYSICIAN ORDER COMMUNICATION  CONCERNING: P&T Medication Policy on Herbal Medications  DESCRIPTION:  This patient's order for:  Garlic, glucosamin-Chondroitin, Tumeric  has been noted.  This product(s) is classified as an "herbal" or natural product. Due to a lack of definitive safety studies or FDA approval, nonstandard manufacturing practices, plus the potential risk of unknown drug-drug interactions while on inpatient medications, the Pharmacy and Therapeutics Committee does not permit the use of "herbal" or natural products of this type within [facility].   ACTION TAKEN: The pharmacy department is unable to verify this order at this time and your patient has been informed of this safety policy. Please reevaluate patient's clinical condition at discharge and address if the herbal or natural product(s) should be resumed at that time.

## 2015-09-17 NOTE — ED Notes (Signed)
Patient reduced to 2L 02. Will monitor for any changes then take to tele floor.

## 2015-09-18 ENCOUNTER — Inpatient Hospital Stay: Payer: Medicare Other

## 2015-09-18 DIAGNOSIS — I130 Hypertensive heart and chronic kidney disease with heart failure and stage 1 through stage 4 chronic kidney disease, or unspecified chronic kidney disease: Secondary | ICD-10-CM | POA: Diagnosis not present

## 2015-09-18 LAB — GLUCOSE, CAPILLARY
GLUCOSE-CAPILLARY: 145 mg/dL — AB (ref 65–99)
GLUCOSE-CAPILLARY: 107 mg/dL — AB (ref 65–99)
GLUCOSE-CAPILLARY: 152 mg/dL — AB (ref 65–99)
Glucose-Capillary: 131 mg/dL — ABNORMAL HIGH (ref 65–99)

## 2015-09-18 LAB — BASIC METABOLIC PANEL
Anion gap: 11 (ref 5–15)
BUN: 51 mg/dL — AB (ref 6–20)
CALCIUM: 8.8 mg/dL — AB (ref 8.9–10.3)
CO2: 22 mmol/L (ref 22–32)
CREATININE: 2.34 mg/dL — AB (ref 0.61–1.24)
Chloride: 104 mmol/L (ref 101–111)
GFR calc non Af Amer: 24 mL/min — ABNORMAL LOW (ref >60)
GFR, EST AFRICAN AMERICAN: 28 mL/min — AB (ref >60)
Glucose, Bld: 224 mg/dL — ABNORMAL HIGH (ref 65–99)
Potassium: 4.9 mmol/L (ref 3.5–5.1)
Sodium: 137 mmol/L (ref 135–145)

## 2015-09-18 LAB — URINALYSIS COMPLETE WITH MICROSCOPIC (ARMC ONLY)
BACTERIA UA: NONE SEEN
Bilirubin Urine: NEGATIVE
GLUCOSE, UA: NEGATIVE mg/dL
Ketones, ur: NEGATIVE mg/dL
LEUKOCYTES UA: NEGATIVE
Nitrite: NEGATIVE
PROTEIN: NEGATIVE mg/dL
SPECIFIC GRAVITY, URINE: 1.015 (ref 1.005–1.030)
pH: 5 (ref 5.0–8.0)

## 2015-09-18 LAB — CBC
HCT: 39.5 % — ABNORMAL LOW (ref 40.0–52.0)
Hemoglobin: 12.7 g/dL — ABNORMAL LOW (ref 13.0–18.0)
MCH: 34.8 pg — ABNORMAL HIGH (ref 26.0–34.0)
MCHC: 32.1 g/dL (ref 32.0–36.0)
MCV: 108.3 fL — AB (ref 80.0–100.0)
PLATELETS: 141 10*3/uL — AB (ref 150–440)
RBC: 3.65 MIL/uL — AB (ref 4.40–5.90)
RDW: 14.9 % — AB (ref 11.5–14.5)
WBC: 14.9 10*3/uL — AB (ref 3.8–10.6)

## 2015-09-18 LAB — PROTEIN / CREATININE RATIO, URINE
CREATININE, URINE: 198 mg/dL
Protein Creatinine Ratio: 0.09 mg/mg{Cre} (ref 0.00–0.15)
TOTAL PROTEIN, URINE: 18 mg/dL

## 2015-09-18 LAB — TROPONIN I: TROPONIN I: 0.12 ng/mL — AB (ref <0.031)

## 2015-09-18 MED ORDER — METOLAZONE 5 MG PO TABS
5.0000 mg | ORAL_TABLET | Freq: Every day | ORAL | Status: DC
  Administered 2015-09-18: 5 mg via ORAL
  Filled 2015-09-18: qty 1

## 2015-09-18 MED ORDER — TORSEMIDE 20 MG PO TABS
40.0000 mg | ORAL_TABLET | Freq: Two times a day (BID) | ORAL | Status: DC
  Filled 2015-09-18: qty 2

## 2015-09-18 MED ORDER — BENZONATATE 100 MG PO CAPS
200.0000 mg | ORAL_CAPSULE | Freq: Three times a day (TID) | ORAL | Status: DC | PRN
  Administered 2015-09-18: 200 mg via ORAL
  Filled 2015-09-18: qty 2

## 2015-09-18 MED ORDER — FUROSEMIDE 10 MG/ML IJ SOLN: 20.0000 mg | Freq: Two times a day (BID) | INTRAMUSCULAR | Status: DC

## 2015-09-18 MED ORDER — SODIUM CHLORIDE 0.9 % IV BOLUS (SEPSIS)
500.0000 mL | Freq: Once | INTRAVENOUS | Status: AC
  Administered 2015-09-18: 500 mL via INTRAVENOUS

## 2015-09-18 MED ORDER — ASPIRIN EC 81 MG PO TBEC
162.0000 mg | DELAYED_RELEASE_TABLET | Freq: Once | ORAL | Status: AC
  Administered 2015-09-18: 162 mg via ORAL
  Filled 2015-09-18: qty 2
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
## 2015-09-18 NOTE — Progress Notes (Signed)
Informed Dr. Anne HahnWillis that patient requested something to help him sleep. New order: trazodone 25 mg. Nurse also informed Dr. Anne HahnWillis that patient had 16 beats of V tach. VS stabled, patient is currently resting comfortably in bed. No new orders. Continue to monitor.

## 2015-09-18 NOTE — Progress Notes (Signed)
Informed Dr. Anne HahnWillis that patient blood pressure was 70/41. Nurse also informed Dr. Anne HahnWillis that patient coughed up dark blood the size of a quarter. New order: NS 500 bolus and tessalon 200 mg. Will continue to monitor.

## 2015-09-18 NOTE — Progress Notes (Signed)
patient was c/o not feeling well and having slight SOB , patient was put back on   2 lit and vs done , see flow sheet , md informed who order portable chest x-ray to be done, 02 sat 91% on  2 lit. Will continue to monitor

## 2015-09-18 NOTE — Progress Notes (Signed)
Nurse reassessed patient's respiratory status. Nurse noticed that patient did not have nasal cannula on. Nurse encouraged patient that he was to keep O2 on. Nurse explained that his current O2 stats are 86% on room air. Patient stated "she told me I didn't need oxygen". Nurse applied nasal cannula, and showed patient's his current o2 stats on monitor. Patient is currently on at 91% on 4L. Will continue to monitor.

## 2015-09-18 NOTE — Progress Notes (Signed)
Informed Dr. Anne HahnWillis around 2200 that patient  O2 stats are fluctuating between the low 80's and high 80's ON 4L. Upper lobes are clear and bilateral  Base lobes noted fine crackles. New order: chest X ray and bipap. Will continue to monitor

## 2015-09-18 NOTE — Progress Notes (Signed)
[facility] [place] at [facility]   PATIENT NAME: Andre [name]    MR#:  [id number]  DATE OF BIRTH:  1929/09/27  SUBJECTIVE:  CHIEF COMPLAINT:  Patients sob is much better. Denies any chest pain. Resting comfortably. Doesn't like any IV medications, asking them to change to by mouth pills REVIEW OF SYSTEMS:  CONSTITUTIONAL: No fever, fatigue or weakness.  EYES: No blurred or double vision.  EARS, NOSE, AND THROAT: No tinnitus or ear pain.  RESPIRATORY: No cough, improved shortness of breath, no wheezing or hemoptysis.  CARDIOVASCULAR: No chest pain, orthopnea, edema.  GASTROINTESTINAL: No nausea, vomiting, diarrhea or abdominal pain.  GENITOURINARY: No dysuria, hematuria.  ENDOCRINE: No polyuria, nocturia,  HEMATOLOGY: No anemia, easy bruising or bleeding SKIN: No rash or lesion. MUSCULOSKELETAL: No joint pain or arthritis.   NEUROLOGIC: No tingling, numbness, weakness.  PSYCHIATRY: No anxiety or depression.   DRUG ALLERGIES:  Not on File  VITALS:  Blood pressure 109/84, pulse 72, temperature 98 F (36.7 C), temperature source Oral, resp. rate 18, height  (1.727 m), weight 91.173 kg (201 lb), SpO2 93 %.  PHYSICAL EXAMINATION:  GENERAL:  81 y.o.-year-old patient lying in the bed with no acute distress.  EYES: Pupils equal, round, reactive to light and accommodation. No scleral icterus. Extraocular muscles intact.  HEENT: Head atraumatic, normocephalic. Oropharynx and nasopharynx clear.  NECK:  Supple, no jugular venous distention. No thyroid enlargement, no tenderness.  LUNGS: Normal breath sounds bilaterally but decreased at bases, no wheezing, rales,rhonchi or crepitation. No use of accessory muscles of respiration.  CARDIOVASCULAR: S1, S2 normal. No murmurs, rubs, or gallops.  ABDOMEN: Soft, nontender, nondistended. Bowel sounds present. No organomegaly or mass.  EXTREMITIES: No pedal edema, cyanosis, or clubbing.  NEUROLOGIC: Cranial nerves II  through XII are intact. Muscle strength 5/5 in all extremities. Sensation intact. Gait not checked.  PSYCHIATRIC: The patient is alert and oriented x 3.  SKIN: No obvious rash, lesion, or ulcer.    LABORATORY PANEL:   CBC  Recent Labs Lab 09/18/15 0222  WBC 14.9*  HGB 12.7*  HCT 39.5*  PLT 141*   ------------------------------------------------------------------------------------------------------------------  Chemistries   Recent Labs Lab 10/01/2015 0928 09/18/15 0222  NA 140 137  K 4.9 4.9  CL 105 104  CO2 24 22  GLUCOSE 334* 224*  BUN 34* 51*  CREATININE 1.63* 2.34*  CALCIUM 9.3 8.8*  AST 30  --   ALT 19  --   ALKPHOS 132*  --   BILITOT 1.3*  --    ------------------------------------------------------------------------------------------------------------------  Cardiac Enzymes  Recent Labs Lab 09/18/15 0222  TROPONINI 0.12*   ------------------------------------------------------------------------------------------------------------------  RADIOLOGY:  Ct Chest Wo Contrast  09/26/2015  CLINICAL DATA:  Shortness of breath. Atrial fibrillation. COPD. Evaluate for pneumonia. EXAM: CT CHEST WITHOUT CONTRAST TECHNIQUE: Multidetector CT imaging of the chest was performed following the standard protocol without IV contrast. COMPARISON:  10/03/2013 FINDINGS: Mediastinum/Nodes: Minimal motion degradation, especially inferiorly. Pacer. Focal dilatation of the transverse aorta, including at 1.8 cm (image 19, series 2). Moderate cardiomegaly. Multivessel coronary artery atherosclerosis. Pulmonary artery enlargement, outflow tract 3.9 cm. Mediastinal adenopathy, including a 1.6 cm low right paratracheal node (image 24, series 2). Hilar regions poorly evaluated without intravenous contrast. Lungs/Pleura: Small bilateral pleural effusions. Right worse than left bibasilar airspace disease. patchy ground-glass opacities throughout. Upper abdomen: Old granulomatous disease in the  liver. Gallstones. Normal imaged portions of the spleen, stomach, pancreas, adrenal glands. Left worse than right renal cortical thinning. Musculoskeletal:  Remote bilateral rib trauma. Advanced thoracic spondylosis. IMPRESSION: 1. Bilateral pleural effusions and patchy ground-glass opacities, suspicious for pulmonary edema. 2. Concurrent bibasilar airspace disease, suspicious for infection. 3. Pulmonary artery enlargement suggests pulmonary arterial hypertension. 4. Cardiomegaly. Atherosclerosis, including within the coronary arteries. 5. Chronic thoracic adenopathy, favored to be reactive. 6. Focal outpouching of the transverse aorta is similar, and given rib fractures could relate to posttraumatic pseudoaneurysm. 7. Cholelithiasis. Electronically Signed   By: Jeronimo GreavesKyle  Talbot M.D.   On: 03/30/2015 13:29   Dg Chest Port 1 View  09/24/2015  CLINICAL DATA:  Shortness of breath EXAM: PORTABLE CHEST 1 VIEW COMPARISON:  10/03/2013 chest CT FINDINGS: Single lead left subclavian pacemaker is noted with lead tip overlying the right ventricle. Stable cardiomediastinal silhouette with mild cardiomegaly. No pneumothorax. Small right pleural effusion. There diffuse parahilar linear and fluffy opacities in both lungs. There are superimposed patchy bibasilar lung opacities. IMPRESSION: 1. Stable mild cardiomegaly. Diffuse parahilar linear and fluffy opacities in both lungs, favor mild to moderate pulmonary edema from congestive heart failure. 2. Superimposed patchy bibasilar lung opacities, nonspecific, likely atelectasis, cannot exclude aspiration or pneumonia. 3. Small right pleural effusion. Electronically Signed   By: Delbert PhenixJason A Poff M.D.   On: 03/30/2015 09:58    EKG:   Orders placed or performed in visit on 04/25/04  . EKG 12-Lead    ASSESSMENT AND PLAN:   Andre [name] is a 81 y.o. male with a known history of chronic atrial fibrillation on eliquis, nonischemic cardiomyopathy, CHF, history of complete heart block  status post pacemaker, CKD and hypertension presents to the hospital secondary to sudden onset of shortness of breath that started this morning.  #1 Acute hypoxic respiratory failure- from CHF exacerbation and also underlying pneumonia - diuretics for CHF with Lasix will change to 20 mg IV every 12 hours -Add metolazone 5 m by mouth once daily - Pending blood cultures and continue IV rocephin and azithromycin - nebs as needed for reactive airway disease and wheezing - required Bipap in ER, now on L o2- wean as tolerated - At baseline- does not use any o2 and very active.  #2 Acute on chronic systolic CHF- CXR and CT chest with pulmonary edema -  lasix IV bid, potassium supplements - last EF 40% from cardiac cath in 2016 per [facility] records - ECHO, appreciate card recommendations  #3 CKD- Acute renal failure on CKD stage 3, baseline cr around 1.3-1.5 - monitor while being diuresed -Nephrology consult is placed as renal function is getting worse with diuresis Avoid nephrotoxins-  #4 Hyperglycemia- no h/o DM - hba1c 5.6  #5 Atrial fibrillation- s/p ablation, chronic afib - s/p pacemaker, on toprol - also anticoagulation with eliquis - follows with [facility] cardiology  #6 Gout- Hold allopurinol and colchicine  #7 Hypothyroidism- continue synthroid  #8 DVT Prophylaxis- Eliquis      All the records are reviewed and case discussed with Care Management/Social Workerr. Management plans discussed with the patient, family and they are in agreement.  CODE STATUS:   TOTAL TIME TAKING CARE OF THIS PATIENT: 35  minutes.   POSSIBLE D/C IN 2-3  DAYS, DEPENDING ON CLINICAL CONDITION.   Ramonita LabGouru, Aruna M.D on 09/18/2015 at 11:24 AM  Between 7am to 6pm - Pager - [phone] After 6pm go to [email or web address] - password [email or web address] [facility]  [facility]  Office  [phone]  CC: Primary care physician; No primary care Mavrik Bynum on file.
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
## 2015-09-18 NOTE — Progress Notes (Signed)
Informed by NT that patient urine output was low. Patient is currently receiving laxis 40 mg BID. Nurse bladder scanned patient 98cc retained. Will continue to monitor.

## 2015-09-18 NOTE — Progress Notes (Signed)
Initial appointment was made at the [facility] on October 08, 2015 at 10:00am. Thank you.

## 2015-09-18 NOTE — Discharge Instructions (Signed)
[facility] appointment on October 08, 2015 at 10:00am with Talajah Slimp, FNP. Please call [phone] to reschedule.  °

## 2015-09-18 NOTE — Care Management Note (Signed)
Case Management Note  Patient Details  Name: Annye EnglishGordon R Mask MRN: [id number] Date of Birth: 06/15/1929  Subjective/Objective:                 Patient with acute hypoxic respiratory failure.  Patient lives at home with his wife.  Patient's son lives close by for support.  Patient obtains hs medications from [facility].  Patient was on Eliquis prior to admission.  Patient still drives and is employed.  Patient has required acute O2 this admission, however has currently been weaned to RA.  No anticipated discharge needs   Action/Plan: RNCM to follow  Expected Discharge Date:                  Expected Discharge Plan:     In-House Referral:     Discharge planning Services     Post Acute Care Choice:    Choice offered to:     DME Arranged:    DME Agency:     HH Arranged:    HH Agency:     Status of Service:     Medicare Important Message Given:    Date Medicare IM Given:    Medicare IM give by:    Date Additional Medicare IM Given:    Additional Medicare Important Message give by:     If discussed at Long Length of Stay Meetings, dates discussed:    Additional Comments:  Chapman FitchBOWEN, Kristl Morioka T, RN 09/18/2015, 2:29 PM

## 2015-09-18 NOTE — Progress Notes (Signed)
Called by RN to place pt on Bipap. Pt refuses. Pt states his breathing is no difference than it has been in the last few days. Also stated that he takes a fluid pill at home and the RN is refusing to give it to him. Spoke with the RN. Pt SPO2 is 94% on 4L [place], HR 77, RR 20 with coarse crackles in his lower lobes bilaterally and in no apparent respiratory distress.

## 2015-09-19 LAB — GLUCOSE, CAPILLARY: Glucose-Capillary: 142 mg/dL — ABNORMAL HIGH (ref 65–99)

## 2015-09-22 LAB — CULTURE, BLOOD (ROUTINE X 2)
CULTURE: NO GROWTH
Culture: NO GROWTH

## 2015-10-08 ENCOUNTER — Ambulatory Visit: Payer: Medicare (Managed Care) | Admitting: Family

## 2015-10-09 NOTE — Progress Notes (Signed)
   09/13/2015 0000  Clinical Encounter Type  Visited With Patient not available  Visit Type Code  Consult/Referral To Chaplain  Spiritual Encounters  Spiritual Needs Prayer  Chaplain responded to code and prayed silently at the door. Waiting for family to arrive.   Chaplain Elda Dunkerson Ext:[phone]

## 2015-10-09 NOTE — Progress Notes (Signed)
   2015/02/17 0134  Clinical Encounter Type  Visited With Family  Visit Type Death  Consult/Referral To Chaplain  Spiritual Encounters  Spiritual Needs Emotional  Stress Factors  Family Stress Factors Loss  Chaplain sat with family and provided a compassionate presence.   Fisher ScientificChaplain Dyquan Minks [phone]

## 2015-10-09 NOTE — Discharge Summary (Signed)
[facility] [place] [facility]    Death Note - please see Last Note for all details.  PATIENT NAME: Andre [name]    MR#:  [id number]  DATE OF BIRTH:  15-Sep-1929  DATE OF ADMISSION:  10/03/2015  PRIMARY CARE PHYSICIAN: No primary care provider on file.   ADMISSION DIAGNOSIS:  Pneumonia [J18.9] Elevated troponin [R79.89] COPD exacerbation (HCC) [J44.1] CAP (community acquired pneumonia) [J18.9] Elevated lactic acid level [E87.2] Acute on chronic congestive heart failure, unspecified congestive heart failure type (HCC) [I50.9]  Andre OF PRESENT ILLNESS ON ADMISSION (as per Dr Andre [name]):  Andre [name] is a 81 y.o. [name] with a known Andre of chronic atrial fibrillation on [name], Andre [name], Andre [name], Andre [name], Andre [name] the hospital secondary [name] sudden onset of shortness of breath that started this morning. He sent his not use any home oxygen at baseline. He is very active, ambulatory and works 6 days per week. He says he hasn't been sick, no fevers or chills no rhinitis or upper respiratory tract infection symptoms. He went [name] the bathroom this morning and had [name] strain a lot after which he developed respiratory distress that did not get relieved with rest and so presented [name] the emergency room. He has been on torsemide every day for his Andre [name], also takes metolazone as needed. But for the last couple of days he felt that he hasn't voided in spite of taking those medications. He watches his salt usually except on the days that he eats outside. There has been some noncompliance with his diet in the last few days. No pedal edema noted. Chest x-ray revealed pulmonary edema and possible pneumonia. Patient was hypoxic and tachypneic requiring BiPAP on presentation, now weaned off [name] 6 L nasal cannula and appears stable on that.  HOSPITAL COURSE:  Initially required BiPap in ED and then  got IV diuresis with significant improvement in breathing.  On second day of admission he became hypotensive and required 500 cc IV normal saline.  Trial BiPap was again ordered as nursing felt patient had mild increase in respiratory effort.  Patient refused stating he did not need BiPap as he was not short of breath.  He was later found unresponsive at his bedside, cyanotic.  CPR was started and code called.  On Rhythm analysis he was found [name] be in alternating Vtach/Vfib.  Code proceeded for 1 hour, with first 45 minutes patient remained in persistent shockable rhythm, but eventually had PEA and never regained his pulse throughout the duration of the code.  Family was contacted and conversation held with the patient's son in person at patient bedside.    Pronounced dead by Andre [name], Andre [name] on 09/23/2015 at 00:59 after 1 full minute of auscultation revealed absent heart and lung sounds, absent corneal and pupillary reflexes, and absent response [name] painful stimuli.                  Cause of death: Acute on Chronic Congestive Heart Failure, Pneumonia   Andre [name] 10/04/2015, 1:10 AM  [facility] [place] [facility]    OFFICE [phone]  Total clinical and documentation time for today: >30 minutes
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
## 2015-10-09 NOTE — Progress Notes (Signed)
Patient found near side of bed, no active respirations noted. Placed back in bed. No palpable pulse. Code blue called. See code blue sheet.

## 2015-10-09 DEATH — deceased

## 2017-06-10 IMAGING — CT CT CHEST W/O CM
2 of 3 series · 16 of 46 positions shown, 18 images · non-contrast
Comparison: 10/03/2013

CLINICAL DATA: Shortness of breath. Atrial fibrillation. COPD.
Evaluate for pneumonia.

EXAM:
CT CHEST WITHOUT CONTRAST
TECHNIQUE: Multidetector CT imaging of the chest was performed following the
standard protocol without IV contrast.

[Series 2: routine chest wo · axial · 0.78mm/px · z∈[-211,+74]mm · 13 of 67 slices shown, 15 images]
[im 5/67  soft-tissue]
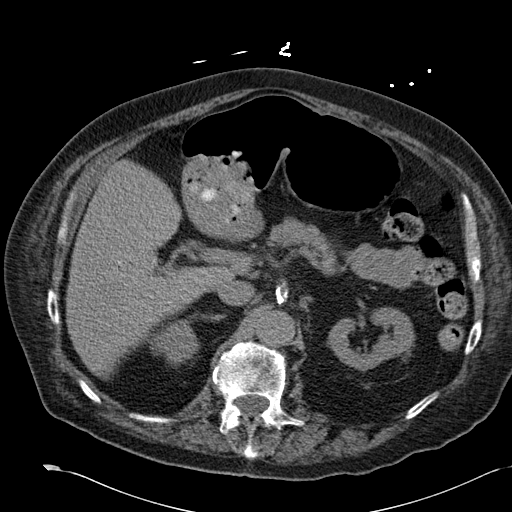
[im 5/67  bone]
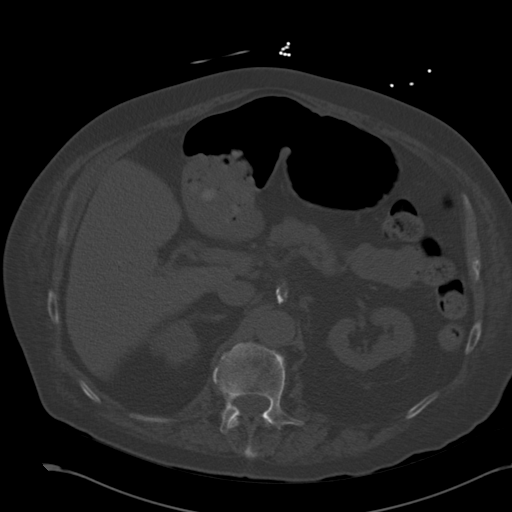
[im 9/67  soft-tissue]
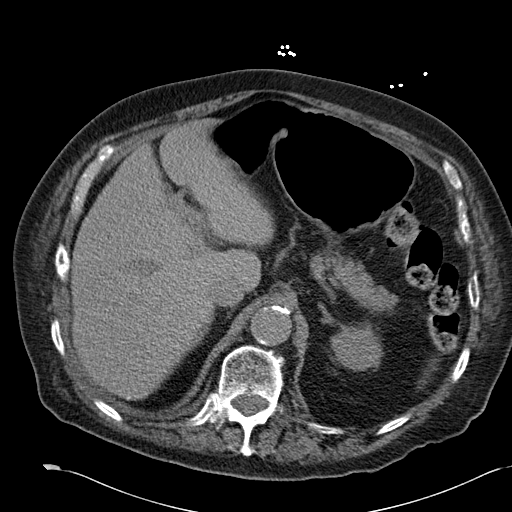
[im 13/67  soft-tissue]
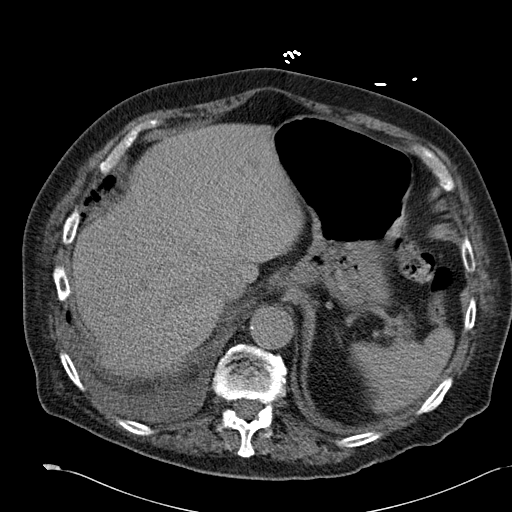
[im 20/67  soft-tissue]
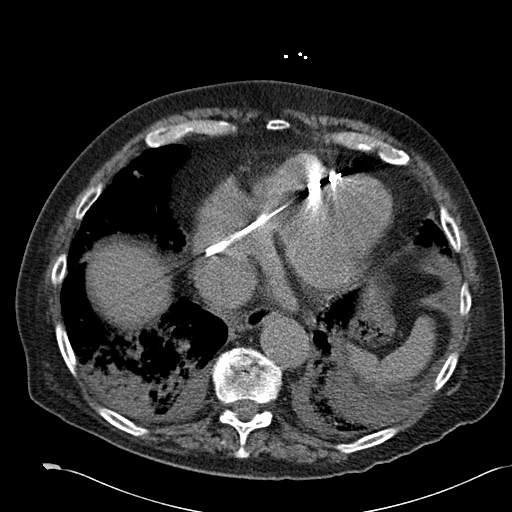
[im 24/67  soft-tissue]
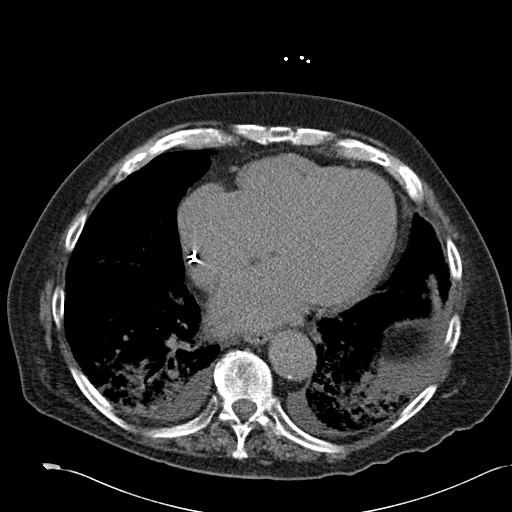
[im 28/67  soft-tissue]
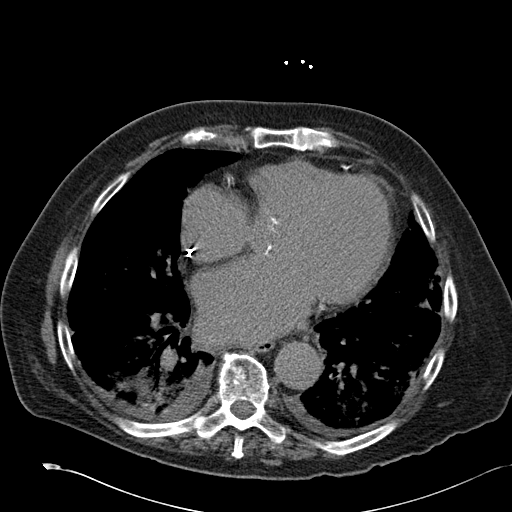
[im 35/67  soft-tissue]
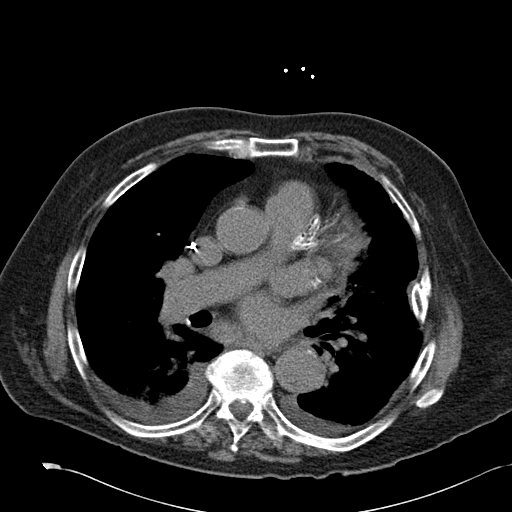
[im 39/67  soft-tissue]
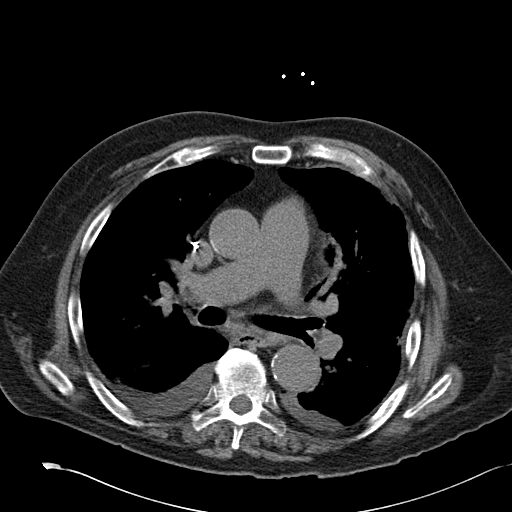
[im 43/67  soft-tissue]
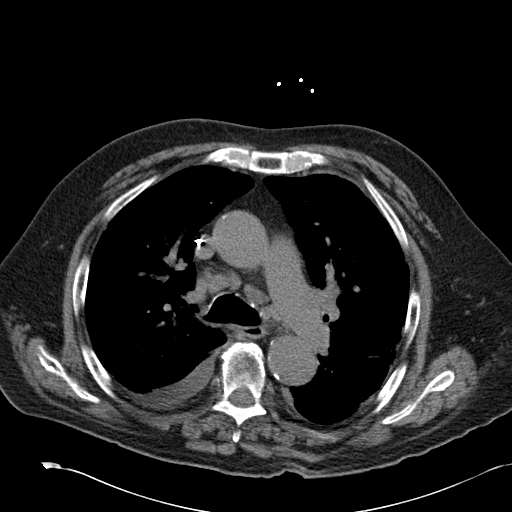
[im 43/67  bone]
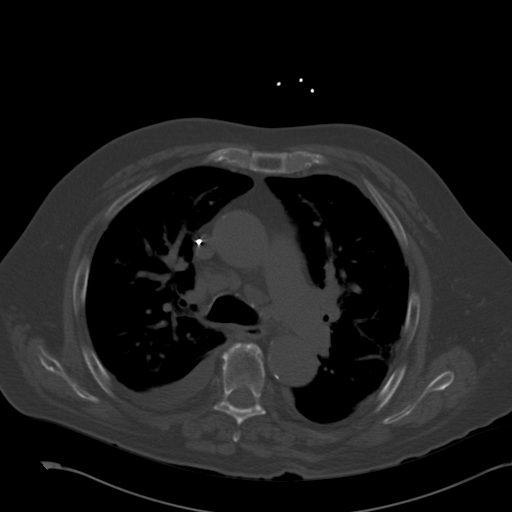
[im 47/67  soft-tissue]
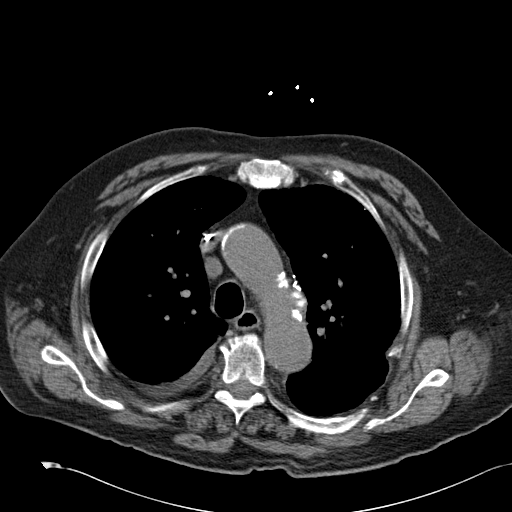
[im 54/67  soft-tissue]
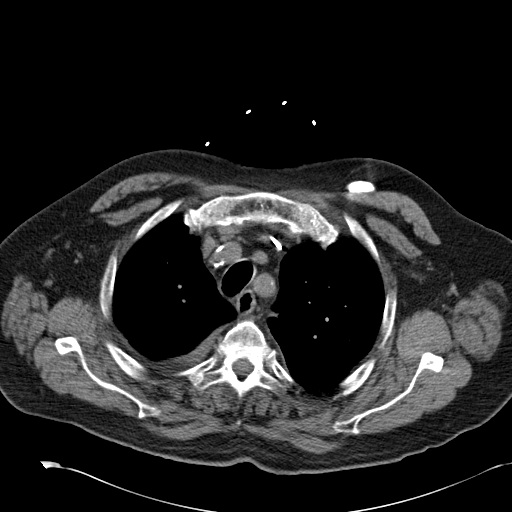
[im 58/67  soft-tissue]
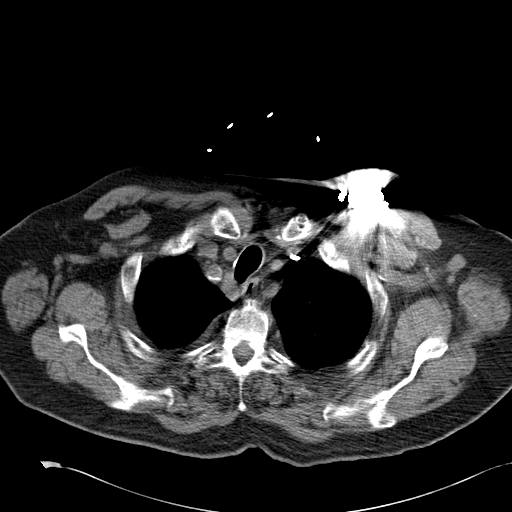
[im 62/67  soft-tissue]
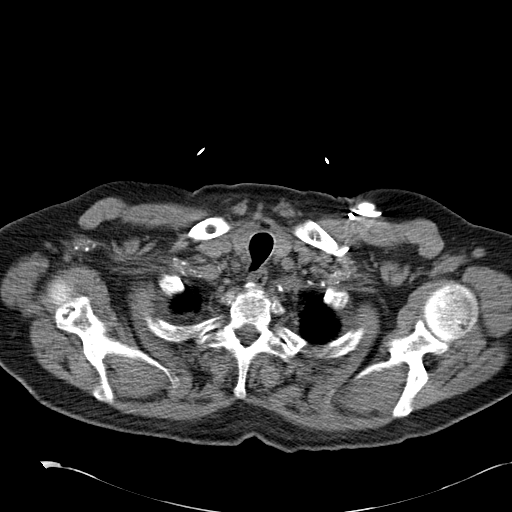

[Series 5: routine chest wo cor · coronal · 0.65mm/px · 3 of 147 slices shown]
[im 49/147  soft-tissue]
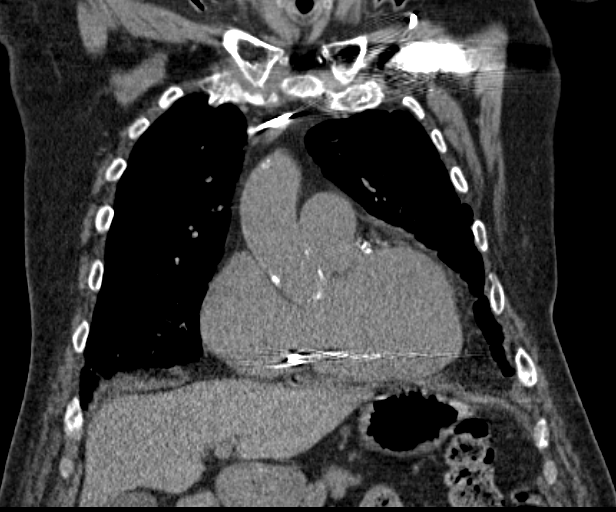
[im 65/147  soft-tissue]
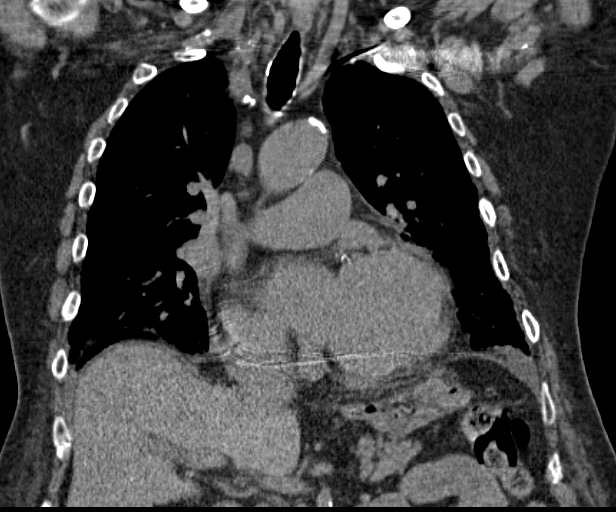
[im 82/147  soft-tissue]
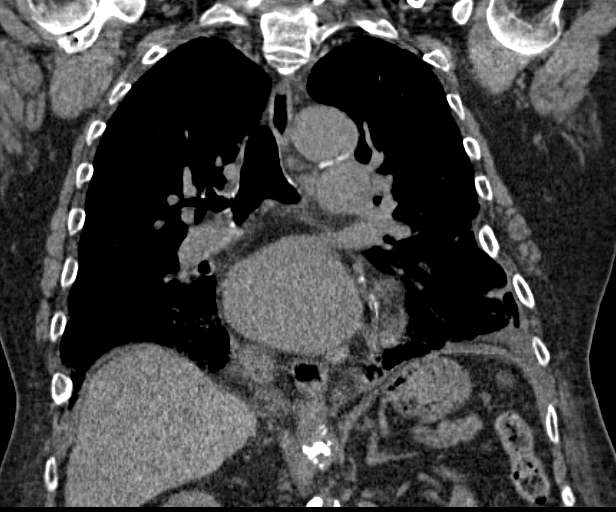

[16 of 46 positions shown; findings below may reference images not displayed]

FINDINGS: Mediastinum/Nodes: Minimal motion degradation, especially
inferiorly. Pacer. Focal dilatation of the transverse aorta,
including at 1.8 cm (image 19, series 2). Moderate cardiomegaly.
Multivessel coronary artery atherosclerosis. Pulmonary artery
enlargement, outflow tract 3.9 cm.

Mediastinal adenopathy, including a 1.6 cm low right paratracheal
node (image 24, series 2). Hilar regions poorly evaluated without
intravenous contrast.

Lungs/Pleura: Small bilateral pleural effusions. Right worse than
left bibasilar airspace disease. patchy ground-glass opacities
throughout.

Upper abdomen: Old granulomatous disease in the liver. Gallstones.
Normal imaged portions of the spleen, stomach, pancreas, adrenal
glands. Left worse than right renal cortical thinning.

Musculoskeletal: Remote bilateral rib trauma. Advanced thoracic
spondylosis.
IMPRESSION: 1. Bilateral pleural effusions and patchy ground-glass opacities,
suspicious for pulmonary edema.
2. Concurrent bibasilar airspace disease, suspicious for infection.
3. Pulmonary artery enlargement suggests pulmonary arterial
hypertension.
4. Cardiomegaly. Atherosclerosis, including within the coronary
arteries.
5. Chronic thoracic adenopathy, favored to be reactive.
6. Focal outpouching of the transverse aorta is similar, and given
rib fractures could relate to posttraumatic pseudoaneurysm.
7. Cholelithiasis.
